# Patient Record
Sex: Male | Born: 1935 | Race: White | Hispanic: No | Marital: Married | Smoking: Former smoker
Health system: Southern US, Community
[De-identification: ages and names within clinical notes are randomized; demographics above are authoritative.]

## PROBLEM LIST (undated history)

## (undated) DIAGNOSIS — Z87442 Personal history of urinary calculi: Secondary | ICD-10-CM

## (undated) DIAGNOSIS — J189 Pneumonia, unspecified organism: Secondary | ICD-10-CM

## (undated) DIAGNOSIS — I10 Essential (primary) hypertension: Secondary | ICD-10-CM

## (undated) DIAGNOSIS — G8929 Other chronic pain: Secondary | ICD-10-CM

## (undated) DIAGNOSIS — M545 Low back pain, unspecified: Secondary | ICD-10-CM

## (undated) DIAGNOSIS — K219 Gastro-esophageal reflux disease without esophagitis: Secondary | ICD-10-CM

## (undated) DIAGNOSIS — C4491 Basal cell carcinoma of skin, unspecified: Secondary | ICD-10-CM

## (undated) DIAGNOSIS — C61 Malignant neoplasm of prostate: Secondary | ICD-10-CM

## (undated) DIAGNOSIS — M1990 Unspecified osteoarthritis, unspecified site: Secondary | ICD-10-CM

## (undated) DIAGNOSIS — K250 Acute gastric ulcer with hemorrhage: Secondary | ICD-10-CM

## (undated) DIAGNOSIS — E039 Hypothyroidism, unspecified: Secondary | ICD-10-CM

## (undated) HISTORY — PX: TONSILLECTOMY: SUR1361

## (undated) HISTORY — PX: CATARACT EXTRACTION W/ INTRAOCULAR LENS  IMPLANT, BILATERAL: SHX1307

## (undated) HISTORY — PX: ESOPHAGOGASTRODUODENOSCOPY: SHX1529

## (undated) HISTORY — PX: INSERTION PROSTATE RADIATION SEED: SUR718

## (undated) HISTORY — PX: COLONOSCOPY: SHX174

## (undated) HISTORY — PX: DENTAL SURGERY: SHX609

## (undated) HISTORY — PX: TRANSURETHRAL RESECTION OF PROSTATE: SHX73

## (undated) HISTORY — PX: BACK SURGERY: SHX140

## (undated) HISTORY — PX: BASAL CELL CARCINOMA EXCISION: SHX1214

## (undated) HISTORY — PX: PROSTATE BIOPSY: SHX241

---

## 1997-11-20 ENCOUNTER — Encounter: Payer: Self-pay | Admitting: Neurological Surgery

## 1997-11-20 ENCOUNTER — Ambulatory Visit (HOSPITAL_COMMUNITY): Admission: RE | Admit: 1997-11-20 | Discharge: 1997-11-20 | Payer: Self-pay | Admitting: Neurological Surgery

## 1997-11-27 ENCOUNTER — Encounter: Payer: Self-pay | Admitting: Neurological Surgery

## 1997-12-01 ENCOUNTER — Encounter: Payer: Self-pay | Admitting: Neurological Surgery

## 1997-12-01 ENCOUNTER — Inpatient Hospital Stay (HOSPITAL_COMMUNITY): Admission: RE | Admit: 1997-12-01 | Discharge: 1997-12-03 | Payer: Self-pay | Admitting: Neurological Surgery

## 2000-12-28 ENCOUNTER — Encounter: Payer: Self-pay | Admitting: Orthopedic Surgery

## 2000-12-28 ENCOUNTER — Ambulatory Visit (HOSPITAL_COMMUNITY): Admission: RE | Admit: 2000-12-28 | Discharge: 2000-12-28 | Payer: Self-pay | Admitting: Orthopedic Surgery

## 2002-07-31 ENCOUNTER — Inpatient Hospital Stay (HOSPITAL_COMMUNITY): Admission: RE | Admit: 2002-07-31 | Discharge: 2002-08-01 | Payer: Self-pay | Admitting: Neurological Surgery

## 2002-07-31 ENCOUNTER — Encounter: Payer: Self-pay | Admitting: Neurological Surgery

## 2002-08-20 ENCOUNTER — Ambulatory Visit (HOSPITAL_COMMUNITY): Admission: RE | Admit: 2002-08-20 | Discharge: 2002-08-20 | Payer: Self-pay | Admitting: Neurological Surgery

## 2002-08-20 ENCOUNTER — Encounter: Payer: Self-pay | Admitting: Neurological Surgery

## 2003-07-22 ENCOUNTER — Ambulatory Visit (HOSPITAL_COMMUNITY): Admission: RE | Admit: 2003-07-22 | Discharge: 2003-07-22 | Payer: Self-pay | Admitting: Neurological Surgery

## 2003-08-20 ENCOUNTER — Inpatient Hospital Stay (HOSPITAL_COMMUNITY): Admission: RE | Admit: 2003-08-20 | Discharge: 2003-08-23 | Payer: Self-pay | Admitting: Neurological Surgery

## 2005-01-16 DIAGNOSIS — K250 Acute gastric ulcer with hemorrhage: Secondary | ICD-10-CM

## 2005-01-16 HISTORY — DX: Acute gastric ulcer with hemorrhage: K25.0

## 2011-01-17 HISTORY — PX: INGUINAL HERNIA REPAIR: SUR1180

## 2012-05-16 DIAGNOSIS — J189 Pneumonia, unspecified organism: Secondary | ICD-10-CM

## 2012-05-16 HISTORY — DX: Pneumonia, unspecified organism: J18.9

## 2015-01-17 HISTORY — PX: RADIOFREQUENCY ABLATION NERVES: SUR1070

## 2015-10-19 ENCOUNTER — Other Ambulatory Visit (HOSPITAL_COMMUNITY): Payer: Self-pay | Admitting: Neurological Surgery

## 2015-10-19 ENCOUNTER — Other Ambulatory Visit: Payer: Self-pay | Admitting: Neurological Surgery

## 2015-10-19 DIAGNOSIS — M48061 Spinal stenosis, lumbar region without neurogenic claudication: Secondary | ICD-10-CM

## 2015-10-27 ENCOUNTER — Ambulatory Visit (HOSPITAL_COMMUNITY)
Admission: RE | Admit: 2015-10-27 | Discharge: 2015-10-27 | Disposition: A | Discharge status: Home / Self Care | Payer: Medicare Other | Source: Ambulatory Visit | Attending: Neurological Surgery | Admitting: Neurological Surgery

## 2015-10-27 DIAGNOSIS — M5116 Intervertebral disc disorders with radiculopathy, lumbar region: Secondary | ICD-10-CM | POA: Insufficient documentation

## 2015-10-27 DIAGNOSIS — M48061 Spinal stenosis, lumbar region without neurogenic claudication: Secondary | ICD-10-CM

## 2015-10-27 DIAGNOSIS — S0003XA Contusion of scalp, initial encounter: Secondary | ICD-10-CM | POA: Diagnosis not present

## 2015-10-27 DIAGNOSIS — M48062 Spinal stenosis, lumbar region with neurogenic claudication: Secondary | ICD-10-CM | POA: Insufficient documentation

## 2015-10-27 DIAGNOSIS — M4726 Other spondylosis with radiculopathy, lumbar region: Secondary | ICD-10-CM | POA: Diagnosis not present

## 2015-10-27 DIAGNOSIS — M4804 Spinal stenosis, thoracic region: Secondary | ICD-10-CM | POA: Insufficient documentation

## 2015-10-27 DIAGNOSIS — I700 Atherosclerosis of aorta: Secondary | ICD-10-CM | POA: Diagnosis not present

## 2015-10-27 MED ORDER — LIDOCAINE HCL 1 % IJ SOLN
INTRAMUSCULAR | Status: AC
  Filled 2015-10-27: qty 10

## 2015-10-27 MED ORDER — DIAZEPAM 5 MG PO TABS
ORAL_TABLET | ORAL | Status: AC
  Filled 2015-10-27: qty 2

## 2015-10-27 MED ORDER — IOPAMIDOL (ISOVUE-M 200) INJECTION 41%
INTRAMUSCULAR | Status: AC
Start: 2015-10-27 — End: 2015-10-27
  Filled 2015-10-27: qty 10

## 2015-10-27 MED ORDER — IOPAMIDOL (ISOVUE-M 200) INJECTION 41%
20.0000 mL | Freq: Once | INTRAMUSCULAR | Status: AC
  Administered 2015-10-27: 11 mL via INTRATHECAL

## 2015-10-27 MED ORDER — ONDANSETRON HCL 4 MG/2ML IJ SOLN: 4.0000 mg | Freq: Four times a day (QID) | INTRAMUSCULAR | Status: DC | PRN

## 2015-10-27 MED ORDER — HYDROCODONE-ACETAMINOPHEN 5-325 MG PO TABS: 1.0000 | ORAL_TABLET | ORAL | Status: DC | PRN

## 2015-10-27 MED ORDER — DIAZEPAM 5 MG PO TABS
10.0000 mg | ORAL_TABLET | Freq: Once | ORAL | Status: AC
  Administered 2015-10-27: 10 mg via ORAL

## 2015-10-27 MED ORDER — LIDOCAINE HCL (PF) 1 % IJ SOLN
5.0000 mL | Freq: Once | INTRAMUSCULAR | Status: AC
  Administered 2015-10-27: 5 mL via INTRADERMAL

## 2015-10-27 NOTE — Procedures (Signed)
Dr. Ammie Ferrier an 82 year old individual is had previous decompression fusion on several separate occasions. He has decompression fusion from L2 to the sacrum. There is evidence of spondylosis at L1-L2 and has had progressive deterioration of his gait along with weakness in his lower extremities. A myelogram is now being performed to further evaluate this process. He has more weakness seemingly on his right side than on his left side.  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 11 mL Findings: Spondylosis throughout the lumbar spine. CT from T10 to the sacrum

## 2015-10-27 NOTE — Discharge Instructions (Signed)
Myelography, Care After °These instructions give you information on caring for yourself after your procedure. Your doctor may also give you more specific instructions. Call your doctor if you have any problems or questions after your procedure. °HOME CARE °· Rest the first day. °· When you rest, lie flat, with your head slightly raised (elevated). °· Avoid heavy lifting and activity for 48 hours, or as told by your doctor. °· You may take the bandage (dressing) off one day after the test, or as told by your doctor. °· Take all medicines only as told by your doctor. °· Ask your doctor when it is okay to take a shower or bath. °· Ask your doctor when your test results will be ready and how you can get them. Make sure you follow up and get your results. °· Do not drink alcohol for 24 hours, or as told by your doctor. °· Drink enough fluid to keep your pee (urine) clear or pale yellow. °GET HELP IF:  °· You have a fever. °· You have a headache. °· You feel sick to your stomach (nauseous) or throw up (vomit). °· You have pain or cramping in your belly (abdomen). °GET HELP RIGHT AWAY IF:  °· You have a headache with a stiff neck or fever. °· You have trouble breathing. °· Any of the places where the needles were put in are: °¨ Puffy (swollen) or red. °¨ Sore or hot to the touch. °¨ Draining yellowish-white fluid (pus). °¨ Bleeding. °MAKE SURE YOU: °· Understand these instructions. °· Will watch your condition. °· Will get help right away if you are not doing well or get worse. °  °This information is not intended to replace advice given to you by your health care provider. Make sure you discuss any questions you have with your health care provider. °  °Document Released: 10/12/2007 Document Revised: 01/23/2014 Document Reviewed: 09/27/2011 °Elsevier Interactive Patient Education ©2016 [employer] ° °

## 2015-11-18 ENCOUNTER — Other Ambulatory Visit: Payer: Self-pay | Admitting: Neurological Surgery

## 2015-12-14 NOTE — Pre-Procedure Instructions (Signed)
Christopher Hall  12/14/2015      [facility], [place] [address] [place] [address] Phone: [phone] Fax: [phone]    Your procedure is scheduled on December 4  Report to [place] at [facility].M.  Call this number if you have problems the morning of surgery:  [phone]   Remember:  Do not eat food or drink liquids after midnight.   Take these medicines the morning of surgery with A SIP OF WATER amLODipine (NORVASC), gabapentin (NEURONTIN),  levothyroxine (SYNTHROID, LEVOTHROID), omeprazole (PRILOSEC)  7 days prior to surgery STOP taking any Aspirin, Aleve, Naproxen, Ibuprofen, Motrin, Advil, Goody's, BC's, all herbal medications, fish oil, and all vitamins    Do not wear jewelry.  Do not wear lotions, powders, or cologne, or deoderant.  Men may shave face and neck.  Do not bring valuables to the hospital.  [facility] is not responsible for any belongings or valuables.  Contacts, dentures or bridgework may not be worn into surgery.  Leave your suitcase in the car.  After surgery it may be brought to your room.  For patients admitted to the hospital, discharge time will be determined by your treatment team.  Patients discharged the day of surgery will not be allowed to drive home.    Special instructions:   [facility]- Preparing For Surgery  Before surgery, you can play an important role. Because skin is not sterile, your skin needs to be as free of germs as possible. You can reduce the number of germs on your skin by washing with CHG (chlorahexidine gluconate) Soap before surgery.  CHG is an antiseptic cleaner which kills germs and bonds with the skin to continue killing germs even after washing.  Please do not use if you have an allergy to CHG or antibacterial soaps. If your skin becomes reddened/irritated stop using the CHG.  Do not shave (including legs and underarms) for at  least 48 hours prior to first CHG shower. It is OK to shave your face.  Please follow these instructions carefully.   1. Shower the NIGHT BEFORE SURGERY and the MORNING OF SURGERY with CHG.   2. If you chose to wash your hair, wash your hair first as usual with your normal shampoo.  3. After you shampoo, rinse your hair and body thoroughly to remove the shampoo.  4. Use CHG as you would any other liquid soap. You can apply CHG directly to the skin and wash gently with a scrungie or a clean washcloth.   5. Apply the CHG Soap to your body ONLY FROM THE NECK DOWN.  Do not use on open wounds or open sores. Avoid contact with your eyes, ears, mouth and genitals (private parts). Wash genitals (private parts) with your normal soap.  6. Wash thoroughly, paying special attention to the area where your surgery will be performed.  7. Thoroughly rinse your body with warm water from the neck down.  8. DO NOT shower/wash with your normal soap after using and rinsing off the CHG Soap.  9. Pat yourself dry with a CLEAN TOWEL.   10. Wear CLEAN PAJAMAS   11. Place CLEAN SHEETS on your bed the night of your first shower and DO NOT SLEEP WITH PETS.    Day of Surgery: Do not apply any deodorants/lotions. Please wear clean clothes to the hospital/surgery center.      Please read over the following  fact sheets that you were given.

## 2015-12-15 ENCOUNTER — Encounter (HOSPITAL_COMMUNITY): Payer: Self-pay

## 2015-12-15 ENCOUNTER — Other Ambulatory Visit: Payer: Self-pay

## 2015-12-15 ENCOUNTER — Encounter (HOSPITAL_COMMUNITY)
Admission: RE | Admit: 2015-12-15 | Discharge: 2015-12-15 | Disposition: A | Discharge status: Home / Self Care | Payer: Medicare Other | Source: Ambulatory Visit | Attending: Neurological Surgery | Admitting: Neurological Surgery

## 2015-12-15 DIAGNOSIS — Z0183 Encounter for blood typing: Secondary | ICD-10-CM | POA: Insufficient documentation

## 2015-12-15 DIAGNOSIS — Z01818 Encounter for other preprocedural examination: Secondary | ICD-10-CM | POA: Diagnosis present

## 2015-12-15 DIAGNOSIS — M960 Pseudarthrosis after fusion or arthrodesis: Secondary | ICD-10-CM | POA: Diagnosis not present

## 2015-12-15 DIAGNOSIS — Z01812 Encounter for preprocedural laboratory examination: Secondary | ICD-10-CM | POA: Insufficient documentation

## 2015-12-15 HISTORY — DX: Personal history of urinary calculi: Z87.442

## 2015-12-15 HISTORY — DX: Essential (primary) hypertension: I10

## 2015-12-15 HISTORY — DX: Hypothyroidism, unspecified: E03.9

## 2015-12-15 HISTORY — DX: Unspecified osteoarthritis, unspecified site: M19.90

## 2015-12-15 HISTORY — DX: Gastro-esophageal reflux disease without esophagitis: K21.9

## 2015-12-15 HISTORY — DX: Pneumonia, unspecified organism: J18.9

## 2015-12-15 LAB — BASIC METABOLIC PANEL
ANION GAP: 10 (ref 5–15)
BUN: 18 mg/dL (ref 6–20)
CALCIUM: 10 mg/dL (ref 8.9–10.3)
CO2: 26 mmol/L (ref 22–32)
Chloride: 96 mmol/L — ABNORMAL LOW (ref 101–111)
Creatinine, Ser: 1.63 mg/dL — ABNORMAL HIGH (ref 0.61–1.24)
GFR calc Af Amer: 43 mL/min — ABNORMAL LOW (ref >60)
GFR, EST NON AFRICAN AMERICAN: 37 mL/min — AB (ref >60)
Glucose, Bld: 104 mg/dL — ABNORMAL HIGH (ref 65–99)
POTASSIUM: 3.3 mmol/L — AB (ref 3.5–5.1)
SODIUM: 132 mmol/L — AB (ref 135–145)

## 2015-12-15 LAB — CBC
HEMATOCRIT: 43.6 % (ref 39.0–52.0)
Hemoglobin: 15.4 g/dL (ref 13.0–17.0)
MCH: 30.8 pg (ref 26.0–34.0)
MCHC: 35.3 g/dL (ref 30.0–36.0)
MCV: 87.2 fL (ref 78.0–100.0)
Platelets: 237 10*3/uL (ref 150–400)
RBC: 5 MIL/uL (ref 4.22–5.81)
RDW: 13.1 % (ref 11.5–15.5)
WBC: 7 10*3/uL (ref 4.0–10.5)

## 2015-12-15 LAB — TYPE AND SCREEN
ABO/RH(D): A POS
Antibody Screen: NEGATIVE

## 2015-12-15 LAB — SURGICAL PCR SCREEN
MRSA, PCR: NEGATIVE
STAPHYLOCOCCUS AUREUS: NEGATIVE

## 2015-12-15 LAB — ABO/RH: ABO/RH(D): A POS

## 2015-12-15 NOTE — Progress Notes (Signed)
PCP - Romilda Garret Cardiologist - denies  Chest x-ray - not needed EKG - 12/15/15 Stress Test - denies ECHO - denies Cardiac Cath - denies    Patient denies shortness of breath, fever, cough and chest pain at PAT appointment

## 2015-12-19 MED ORDER — CEFAZOLIN SODIUM-DEXTROSE 2-4 GM/100ML-% IV SOLN
2.0000 g | INTRAVENOUS | Status: AC
  Administered 2015-12-20: 2 g via INTRAVENOUS
  Filled 2015-12-19: qty 100

## 2015-12-20 ENCOUNTER — Inpatient Hospital Stay (HOSPITAL_COMMUNITY)
Admission: RE | Admit: 2015-12-20 | Discharge: 2015-12-24 | DRG: 460 | Disposition: A | Discharge status: Home Health | Payer: Medicare Other | Source: Ambulatory Visit | Attending: Neurological Surgery | Admitting: Neurological Surgery

## 2015-12-20 ENCOUNTER — Inpatient Hospital Stay (HOSPITAL_COMMUNITY): Payer: Medicare Other

## 2015-12-20 ENCOUNTER — Inpatient Hospital Stay (HOSPITAL_COMMUNITY): Payer: Medicare Other | Admitting: Certified Registered"

## 2015-12-20 ENCOUNTER — Encounter (HOSPITAL_COMMUNITY): Payer: Self-pay | Admitting: Certified Registered"

## 2015-12-20 ENCOUNTER — Encounter (HOSPITAL_COMMUNITY)
Admission: RE | Disposition: A | Discharge status: Home Health | Payer: Self-pay | Source: Ambulatory Visit | Attending: Neurological Surgery

## 2015-12-20 DIAGNOSIS — T84216A Breakdown (mechanical) of internal fixation device of vertebrae, initial encounter: Secondary | ICD-10-CM | POA: Diagnosis present

## 2015-12-20 DIAGNOSIS — I10 Essential (primary) hypertension: Secondary | ICD-10-CM | POA: Diagnosis present

## 2015-12-20 DIAGNOSIS — M960 Pseudarthrosis after fusion or arthrodesis: Principal | ICD-10-CM | POA: Diagnosis present

## 2015-12-20 DIAGNOSIS — Z85828 Personal history of other malignant neoplasm of skin: Secondary | ICD-10-CM

## 2015-12-20 DIAGNOSIS — Z87891 Personal history of nicotine dependence: Secondary | ICD-10-CM | POA: Diagnosis not present

## 2015-12-20 DIAGNOSIS — M4317 Spondylolisthesis, lumbosacral region: Secondary | ICD-10-CM | POA: Diagnosis present

## 2015-12-20 DIAGNOSIS — E039 Hypothyroidism, unspecified: Secondary | ICD-10-CM | POA: Diagnosis present

## 2015-12-20 DIAGNOSIS — S32009K Unspecified fracture of unspecified lumbar vertebra, subsequent encounter for fracture with nonunion: Secondary | ICD-10-CM | POA: Diagnosis present

## 2015-12-20 DIAGNOSIS — M4805 Spinal stenosis, thoracolumbar region: Secondary | ICD-10-CM | POA: Diagnosis present

## 2015-12-20 DIAGNOSIS — R32 Unspecified urinary incontinence: Secondary | ICD-10-CM | POA: Diagnosis present

## 2015-12-20 DIAGNOSIS — M4804 Spinal stenosis, thoracic region: Secondary | ICD-10-CM | POA: Diagnosis present

## 2015-12-20 DIAGNOSIS — Z8546 Personal history of malignant neoplasm of prostate: Secondary | ICD-10-CM

## 2015-12-20 DIAGNOSIS — M48061 Spinal stenosis, lumbar region without neurogenic claudication: Secondary | ICD-10-CM | POA: Diagnosis present

## 2015-12-20 DIAGNOSIS — M5416 Radiculopathy, lumbar region: Secondary | ICD-10-CM | POA: Diagnosis present

## 2015-12-20 DIAGNOSIS — Z419 Encounter for procedure for purposes other than remedying health state, unspecified: Secondary | ICD-10-CM

## 2015-12-20 HISTORY — DX: Low back pain: M54.5

## 2015-12-20 HISTORY — DX: Acute gastric ulcer with hemorrhage: K25.0

## 2015-12-20 HISTORY — DX: Malignant neoplasm of prostate: C61

## 2015-12-20 HISTORY — DX: Other chronic pain: G89.29

## 2015-12-20 HISTORY — PX: POSTERIOR LUMBAR FUSION 4 LEVEL: SHX6037

## 2015-12-20 HISTORY — DX: Basal cell carcinoma of skin, unspecified: C44.91

## 2015-12-20 HISTORY — DX: Low back pain, unspecified: M54.50

## 2015-12-20 SURGERY — POSTERIOR LUMBAR FUSION 4 LEVEL
Anesthesia: General | Site: Back

## 2015-12-20 MED ORDER — PHENYLEPHRINE HCL 10 MG/ML IJ SOLN
INTRAVENOUS | Status: DC | PRN
  Administered 2015-12-20 (×2): 20 ug/min via INTRAVENOUS

## 2015-12-20 MED ORDER — SODIUM CHLORIDE 0.9 % IV SOLN: 250.0000 mL | INTRAVENOUS | Status: DC

## 2015-12-20 MED ORDER — AMLODIPINE BESYLATE 10 MG PO TABS
10.0000 mg | ORAL_TABLET | Freq: Every evening | ORAL | Status: DC
  Administered 2015-12-21 – 2015-12-23 (×3): 10 mg via ORAL
  Filled 2015-12-20 (×4): qty 1

## 2015-12-20 MED ORDER — SODIUM CHLORIDE 0.9 % IV SOLN
INTRAVENOUS | Status: DC
  Administered 2015-12-20: 17:00:00 via INTRAVENOUS

## 2015-12-20 MED ORDER — ONDANSETRON HCL 4 MG/2ML IJ SOLN: 4.0000 mg | INTRAMUSCULAR | Status: DC | PRN

## 2015-12-20 MED ORDER — PHENYLEPHRINE HCL 10 MG/ML IJ SOLN
INTRAMUSCULAR | Status: DC | PRN
  Administered 2015-12-20 (×3): 80 ug via INTRAVENOUS

## 2015-12-20 MED ORDER — LIDOCAINE-EPINEPHRINE (PF) 2 %-1:200000 IJ SOLN
INTRAMUSCULAR | Status: DC | PRN
  Administered 2015-12-20: 5 mL via INTRADERMAL

## 2015-12-20 MED ORDER — ACETAMINOPHEN 650 MG RE SUPP: 650.0000 mg | RECTAL | Status: DC | PRN

## 2015-12-20 MED ORDER — PHENYLEPHRINE 40 MCG/ML (10ML) SYRINGE FOR IV PUSH (FOR BLOOD PRESSURE SUPPORT)
PREFILLED_SYRINGE | INTRAVENOUS | Status: AC
  Filled 2015-12-20: qty 10

## 2015-12-20 MED ORDER — ARTIFICIAL TEARS OP OINT
TOPICAL_OINTMENT | OPHTHALMIC | Status: DC | PRN
  Administered 2015-12-20: 1 via OPHTHALMIC

## 2015-12-20 MED ORDER — CEFAZOLIN IN D5W 1 GM/50ML IV SOLN
1.0000 g | Freq: Three times a day (TID) | INTRAVENOUS | Status: AC
  Administered 2015-12-20 – 2015-12-21 (×2): 1 g via INTRAVENOUS
  Filled 2015-12-20 (×2): qty 50

## 2015-12-20 MED ORDER — ROCURONIUM BROMIDE 100 MG/10ML IV SOLN
INTRAVENOUS | Status: DC | PRN
  Administered 2015-12-20 (×2): 20 mg via INTRAVENOUS
  Administered 2015-12-20: 10 mg via INTRAVENOUS
  Administered 2015-12-20: 50 mg via INTRAVENOUS
  Administered 2015-12-20: 20 mg via INTRAVENOUS

## 2015-12-20 MED ORDER — BUPIVACAINE HCL (PF) 0.5 % IJ SOLN
INTRAMUSCULAR | Status: DC | PRN
  Administered 2015-12-20: 25 mL
  Administered 2015-12-20: 5 mL

## 2015-12-20 MED ORDER — MAGNESIUM CITRATE PO SOLN
1.0000 | Freq: Once | ORAL | Status: DC | PRN
  Filled 2015-12-20: qty 296

## 2015-12-20 MED ORDER — SODIUM CHLORIDE 0.9% FLUSH
3.0000 mL | Freq: Two times a day (BID) | INTRAVENOUS | Status: DC
  Administered 2015-12-21 – 2015-12-23 (×3): 3 mL via INTRAVENOUS
  Administered 2015-12-23: 22:00:00 via INTRAVENOUS

## 2015-12-20 MED ORDER — SODIUM CHLORIDE 0.9 % IV SOLN
INTRAVENOUS | Status: DC | PRN
  Administered 2015-12-20: 12:00:00 via INTRAVENOUS

## 2015-12-20 MED ORDER — 0.9 % SODIUM CHLORIDE (POUR BTL) OPTIME
TOPICAL | Status: DC | PRN
  Administered 2015-12-20: 1000 mL

## 2015-12-20 MED ORDER — THROMBIN 20000 UNITS EX SOLR
CUTANEOUS | Status: AC
  Filled 2015-12-20: qty 20000

## 2015-12-20 MED ORDER — SODIUM CHLORIDE 0.9% FLUSH: 3.0000 mL | INTRAVENOUS | Status: DC | PRN

## 2015-12-20 MED ORDER — SODIUM CHLORIDE 0.9 % IR SOLN
Status: DC | PRN
  Administered 2015-12-20: 500 mL

## 2015-12-20 MED ORDER — LIDOCAINE 2% (20 MG/ML) 5 ML SYRINGE
INTRAMUSCULAR | Status: AC
  Filled 2015-12-20: qty 5

## 2015-12-20 MED ORDER — BUPIVACAINE HCL (PF) 0.5 % IJ SOLN
INTRAMUSCULAR | Status: AC
  Filled 2015-12-20: qty 30

## 2015-12-20 MED ORDER — ARTIFICIAL TEARS OP OINT
TOPICAL_OINTMENT | OPHTHALMIC | Status: AC
  Filled 2015-12-20: qty 3.5

## 2015-12-20 MED ORDER — ONDANSETRON HCL 4 MG/2ML IJ SOLN
INTRAMUSCULAR | Status: DC | PRN
Start: 2015-12-20 — End: 2015-12-20
  Administered 2015-12-20: 4 mg via INTRAVENOUS

## 2015-12-20 MED ORDER — DEXAMETHASONE SODIUM PHOSPHATE 10 MG/ML IJ SOLN
INTRAMUSCULAR | Status: AC
  Filled 2015-12-20: qty 1

## 2015-12-20 MED ORDER — THROMBIN 20000 UNITS EX SOLR
CUTANEOUS | Status: DC | PRN
  Administered 2015-12-20: 20 mL via TOPICAL

## 2015-12-20 MED ORDER — GABAPENTIN 300 MG PO CAPS
600.0000 mg | ORAL_CAPSULE | Freq: Two times a day (BID) | ORAL | Status: DC
  Administered 2015-12-20 – 2015-12-23 (×7): 600 mg via ORAL
  Filled 2015-12-20 (×8): qty 2

## 2015-12-20 MED ORDER — EPHEDRINE SULFATE 50 MG/ML IJ SOLN
INTRAMUSCULAR | Status: DC | PRN
  Administered 2015-12-20 (×3): 5 mg via INTRAVENOUS

## 2015-12-20 MED ORDER — DOCUSATE SODIUM 100 MG PO CAPS
100.0000 mg | ORAL_CAPSULE | Freq: Two times a day (BID) | ORAL | Status: DC
  Administered 2015-12-20 – 2015-12-23 (×7): 100 mg via ORAL
  Filled 2015-12-20 (×9): qty 1

## 2015-12-20 MED ORDER — THROMBIN 5000 UNITS EX SOLR
OROMUCOSAL | Status: DC | PRN
  Administered 2015-12-20 (×3): 5 mL via TOPICAL

## 2015-12-20 MED ORDER — THROMBIN 5000 UNITS EX SOLR
CUTANEOUS | Status: AC
  Filled 2015-12-20: qty 5000

## 2015-12-20 MED ORDER — ALUM & MAG HYDROXIDE-SIMETH 200-200-20 MG/5ML PO SUSP: 30.0000 mL | Freq: Four times a day (QID) | ORAL | Status: DC | PRN

## 2015-12-20 MED ORDER — OXYCODONE-ACETAMINOPHEN 5-325 MG PO TABS
1.0000 | ORAL_TABLET | ORAL | Status: DC | PRN
  Administered 2015-12-21 – 2015-12-24 (×3): 1 via ORAL
  Filled 2015-12-20 (×3): qty 1

## 2015-12-20 MED ORDER — METHOCARBAMOL 1000 MG/10ML IJ SOLN
500.0000 mg | Freq: Four times a day (QID) | INTRAVENOUS | Status: DC | PRN
  Filled 2015-12-20: qty 5

## 2015-12-20 MED ORDER — SUGAMMADEX SODIUM 200 MG/2ML IV SOLN
INTRAVENOUS | Status: DC | PRN
  Administered 2015-12-20: 162.8 mg via INTRAVENOUS

## 2015-12-20 MED ORDER — POLYETHYLENE GLYCOL 3350 17 G PO PACK
17.0000 g | PACK | Freq: Every day | ORAL | Status: DC | PRN
  Administered 2015-12-23: 17 g via ORAL
  Filled 2015-12-20: qty 1

## 2015-12-20 MED ORDER — ACETAMINOPHEN 325 MG PO TABS
650.0000 mg | ORAL_TABLET | ORAL | Status: DC | PRN
  Administered 2015-12-21 – 2015-12-23 (×6): 650 mg via ORAL
  Filled 2015-12-20 (×6): qty 2

## 2015-12-20 MED ORDER — CHLORHEXIDINE GLUCONATE CLOTH 2 % EX PADS: 6.0000 | MEDICATED_PAD | Freq: Once | CUTANEOUS | Status: DC

## 2015-12-20 MED ORDER — FENTANYL CITRATE (PF) 100 MCG/2ML IJ SOLN
INTRAMUSCULAR | Status: AC
  Filled 2015-12-20: qty 4

## 2015-12-20 MED ORDER — POTASSIUM CHLORIDE CRYS ER 10 MEQ PO TBCR
10.0000 meq | EXTENDED_RELEASE_TABLET | Freq: Every evening | ORAL | Status: DC
  Administered 2015-12-20 – 2015-12-23 (×4): 10 meq via ORAL
  Filled 2015-12-20 (×4): qty 1

## 2015-12-20 MED ORDER — MIRTAZAPINE 15 MG PO TABS
15.0000 mg | ORAL_TABLET | Freq: Every day | ORAL | Status: DC
  Administered 2015-12-20 – 2015-12-23 (×4): 15 mg via ORAL
  Filled 2015-12-20 (×4): qty 1

## 2015-12-20 MED ORDER — LIDOCAINE-EPINEPHRINE (PF) 2 %-1:200000 IJ SOLN
INTRAMUSCULAR | Status: AC
  Filled 2015-12-20: qty 20

## 2015-12-20 MED ORDER — PROPOFOL 10 MG/ML IV BOLUS
INTRAVENOUS | Status: DC | PRN
  Administered 2015-12-20: 160 mg via INTRAVENOUS

## 2015-12-20 MED ORDER — LACTATED RINGERS IV SOLN
INTRAVENOUS | Status: DC | PRN
  Administered 2015-12-20 (×3): via INTRAVENOUS

## 2015-12-20 MED ORDER — PANTOPRAZOLE SODIUM 40 MG PO TBEC
40.0000 mg | DELAYED_RELEASE_TABLET | Freq: Every day | ORAL | Status: DC
  Administered 2015-12-21 – 2015-12-23 (×3): 40 mg via ORAL
  Filled 2015-12-20 (×4): qty 1

## 2015-12-20 MED ORDER — HYDROCHLOROTHIAZIDE 25 MG PO TABS
25.0000 mg | ORAL_TABLET | Freq: Two times a day (BID) | ORAL | Status: DC
  Administered 2015-12-20 – 2015-12-23 (×7): 25 mg via ORAL
  Filled 2015-12-20 (×8): qty 1

## 2015-12-20 MED ORDER — PROPOFOL 10 MG/ML IV BOLUS
INTRAVENOUS | Status: AC
  Filled 2015-12-20: qty 20

## 2015-12-20 MED ORDER — LIDOCAINE HCL (CARDIAC) 20 MG/ML IV SOLN
INTRAVENOUS | Status: DC | PRN
  Administered 2015-12-20: 100 mg via INTRAVENOUS

## 2015-12-20 MED ORDER — THROMBIN 5000 UNITS EX SOLR
CUTANEOUS | Status: AC
  Filled 2015-12-20: qty 10000

## 2015-12-20 MED ORDER — BISACODYL 10 MG RE SUPP
10.0000 mg | Freq: Every day | RECTAL | Status: DC | PRN
  Administered 2015-12-23: 10 mg via RECTAL
  Filled 2015-12-20: qty 1

## 2015-12-20 MED ORDER — DEXAMETHASONE SODIUM PHOSPHATE 10 MG/ML IJ SOLN
INTRAMUSCULAR | Status: DC | PRN
  Administered 2015-12-20: 10 mg via INTRAVENOUS

## 2015-12-20 MED ORDER — LEVOTHYROXINE SODIUM 50 MCG PO TABS
50.0000 ug | ORAL_TABLET | Freq: Every evening | ORAL | Status: DC
  Administered 2015-12-21 – 2015-12-23 (×3): 50 ug via ORAL
  Filled 2015-12-20 (×4): qty 1

## 2015-12-20 MED ORDER — ROCURONIUM BROMIDE 10 MG/ML (PF) SYRINGE
PREFILLED_SYRINGE | INTRAVENOUS | Status: AC
  Filled 2015-12-20: qty 20

## 2015-12-20 MED ORDER — MENTHOL 3 MG MT LOZG
1.0000 | LOZENGE | OROMUCOSAL | Status: DC | PRN
  Filled 2015-12-20: qty 9

## 2015-12-20 MED ORDER — PSYLLIUM 95 % PO PACK
1.0000 | PACK | Freq: Two times a day (BID) | ORAL | Status: DC
  Administered 2015-12-21 – 2015-12-23 (×6): 1 via ORAL
  Filled 2015-12-20 (×9): qty 1

## 2015-12-20 MED ORDER — SENNA 8.6 MG PO TABS
1.0000 | ORAL_TABLET | Freq: Two times a day (BID) | ORAL | Status: DC
  Administered 2015-12-20 – 2015-12-23 (×7): 8.6 mg via ORAL
  Filled 2015-12-20 (×8): qty 1

## 2015-12-20 MED ORDER — FENTANYL CITRATE (PF) 100 MCG/2ML IJ SOLN
INTRAMUSCULAR | Status: DC | PRN
  Administered 2015-12-20: 50 ug via INTRAVENOUS
  Administered 2015-12-20: 25 ug via INTRAVENOUS
  Administered 2015-12-20: 50 ug via INTRAVENOUS
  Administered 2015-12-20: 25 ug via INTRAVENOUS
  Administered 2015-12-20: 100 ug via INTRAVENOUS
  Administered 2015-12-20: 50 ug via INTRAVENOUS

## 2015-12-20 MED ORDER — ALBUMIN HUMAN 5 % IV SOLN
INTRAVENOUS | Status: DC | PRN
  Administered 2015-12-20: 11:00:00 via INTRAVENOUS

## 2015-12-20 MED ORDER — PHENOL 1.4 % MT LIQD: 1.0000 | OROMUCOSAL | Status: DC | PRN

## 2015-12-20 MED ORDER — HYDROMORPHONE HCL 1 MG/ML IJ SOLN: 0.5000 mg | INTRAMUSCULAR | Status: DC | PRN

## 2015-12-20 MED ORDER — METHOCARBAMOL 500 MG PO TABS
500.0000 mg | ORAL_TABLET | Freq: Four times a day (QID) | ORAL | Status: DC | PRN
  Filled 2015-12-20: qty 1

## 2015-12-20 MED ORDER — FENTANYL CITRATE (PF) 100 MCG/2ML IJ SOLN
INTRAMUSCULAR | Status: AC
  Filled 2015-12-20: qty 2

## 2015-12-20 SURGICAL SUPPLY — 74 items
ADH SKN CLS APL DERMABOND .7 (GAUZE/BANDAGES/DRESSINGS) ×1
APL SRG 60D 8 XTD TIP BNDBL (TIP)
BAG DECANTER FOR FLEXI CONT (MISCELLANEOUS) ×3 IMPLANT
BASKET BONE COLLECTION (BASKET) ×3 IMPLANT
BLADE CLIPPER SURG (BLADE) ×4 IMPLANT
BUR MATCHSTICK NEURO 3.0 LAGG (BURR) ×3 IMPLANT
CANISTER SUCT 3000ML PPV (MISCELLANEOUS) ×3 IMPLANT
CARTRIDGE OIL MAESTRO DRILL (MISCELLANEOUS) ×1 IMPLANT
CATH COUDE FOLEY 2W 5CC 16FR (CATHETERS) ×2 IMPLANT
CONNECTOR RELINE 20MM OPEN OFF (Connector) ×2 IMPLANT
CONNECTOR RELINE 30MM OPEN OFF (Connector) ×2 IMPLANT
CONT SPEC 4OZ CLIKSEAL STRL BL (MISCELLANEOUS) ×3 IMPLANT
COVER BACK TABLE 60X90IN (DRAPES) ×3 IMPLANT
DECANTER SPIKE VIAL GLASS SM (MISCELLANEOUS) ×5 IMPLANT
DERMABOND ADVANCED (GAUZE/BANDAGES/DRESSINGS) ×2
DERMABOND ADVANCED .7 DNX12 (GAUZE/BANDAGES/DRESSINGS) ×1 IMPLANT
DEVICE DISSECT PLASMABLAD 3.0S (MISCELLANEOUS) ×1 IMPLANT
DIFFUSER DRILL AIR PNEUMATIC (MISCELLANEOUS) ×3 IMPLANT
DRAPE C-ARM 42X72 X-RAY (DRAPES) ×6 IMPLANT
DRAPE HALF SHEET 40X57 (DRAPES) ×2 IMPLANT
DRAPE LAPAROTOMY 100X72X124 (DRAPES) ×3 IMPLANT
DRAPE POUCH INSTRU U-SHP 10X18 (DRAPES) ×3 IMPLANT
DRSG OPSITE POSTOP 4X8 (GAUZE/BANDAGES/DRESSINGS) ×2 IMPLANT
DURAPREP 26ML APPLICATOR (WOUND CARE) ×3 IMPLANT
DURASEAL APPLICATOR TIP (TIP) IMPLANT
DURASEAL SPINE SEALANT 3ML (MISCELLANEOUS) IMPLANT
ELECT REM PT RETURN 9FT ADLT (ELECTROSURGICAL) ×3
ELECTRODE REM PT RTRN 9FT ADLT (ELECTROSURGICAL) ×1 IMPLANT
GAUZE SPONGE 4X4 12PLY STRL (GAUZE/BANDAGES/DRESSINGS) ×1 IMPLANT
GAUZE SPONGE 4X4 16PLY XRAY LF (GAUZE/BANDAGES/DRESSINGS) ×2 IMPLANT
GLOVE BIO SURGEON STRL SZ8.5 (GLOVE) ×2 IMPLANT
GLOVE BIOGEL PI IND STRL 7.5 (GLOVE) IMPLANT
GLOVE BIOGEL PI IND STRL 8.5 (GLOVE) ×2 IMPLANT
GLOVE BIOGEL PI INDICATOR 7.5 (GLOVE) ×2
GLOVE BIOGEL PI INDICATOR 8.5 (GLOVE) ×6
GLOVE ECLIPSE 8.5 STRL (GLOVE) ×8 IMPLANT
GLOVE SS N UNI LF 7.0 STRL (GLOVE) ×2 IMPLANT
GOWN STRL REUS W/ TWL LRG LVL3 (GOWN DISPOSABLE) IMPLANT
GOWN STRL REUS W/ TWL XL LVL3 (GOWN DISPOSABLE) IMPLANT
GOWN STRL REUS W/TWL 2XL LVL3 (GOWN DISPOSABLE) ×8 IMPLANT
GOWN STRL REUS W/TWL LRG LVL3 (GOWN DISPOSABLE) ×3
GOWN STRL REUS W/TWL XL LVL3 (GOWN DISPOSABLE) ×6
GRAFT BN 10X1XDBM MAGNIFUSE (Bone Implant) IMPLANT
GRAFT BONE MAGNIFUSE 1X10CM (Bone Implant) ×3 IMPLANT
HEMOSTAT POWDER KIT SURGIFOAM (HEMOSTASIS) ×6 IMPLANT
KIT BASIN OR (CUSTOM PROCEDURE TRAY) ×3 IMPLANT
KIT INFUSE MEDIUM (Orthopedic Implant) ×2 IMPLANT
KIT ROOM TURNOVER OR (KITS) ×3 IMPLANT
NEEDLE HYPO 22GX1.5 SAFETY (NEEDLE) ×3 IMPLANT
NS IRRIG 1000ML POUR BTL (IV SOLUTION) ×3 IMPLANT
OIL CARTRIDGE MAESTRO DRILL (MISCELLANEOUS) ×3
PACK LAMINECTOMY NEURO (CUSTOM PROCEDURE TRAY) ×3 IMPLANT
PAD ARMBOARD 7.5X6 YLW CONV (MISCELLANEOUS) ×9 IMPLANT
PATTIES SURGICAL .5 X1 (DISPOSABLE) ×3 IMPLANT
PLASMABLADE 3.0S (MISCELLANEOUS) ×3
ROD RELINE-O 5.5X120MM LORD (Rod) ×2 IMPLANT
ROD RELINE-O LORD 5.5X110MM (Rod) ×2 IMPLANT
SCREW LOCK RELINE 5.5 TULIP (Screw) ×16 IMPLANT
SCREW LOCK RELINE CLOSED TULIP (Screw) ×4 IMPLANT
SCREW RELIN ILIAC 7.5X70MM 2S (Screw) ×4 IMPLANT
SCREW RELINE-O POLY 6.5X50MM (Screw) ×8 IMPLANT
SCREW RELINE-O POLY 7.5X45 (Screw) ×4 IMPLANT
SPONGE LAP 4X18 X RAY DECT (DISPOSABLE) IMPLANT
SPONGE SURGIFOAM ABS GEL 100 (HEMOSTASIS) ×3 IMPLANT
SUT PROLENE 6 0 BV (SUTURE) ×2 IMPLANT
SUT VIC AB 1 CT1 18XBRD ANBCTR (SUTURE) ×1 IMPLANT
SUT VIC AB 1 CT1 8-18 (SUTURE) ×6
SUT VIC AB 2-0 CP2 18 (SUTURE) ×5 IMPLANT
SUT VIC AB 3-0 SH 8-18 (SUTURE) ×7 IMPLANT
SYR 3ML LL SCALE MARK (SYRINGE) ×12 IMPLANT
TOWEL OR 17X24 6PK STRL BLUE (TOWEL DISPOSABLE) ×3 IMPLANT
TOWEL OR 17X26 10 PK STRL BLUE (TOWEL DISPOSABLE) ×3 IMPLANT
TRAY FOLEY W/METER SILVER 16FR (SET/KITS/TRAYS/PACK) ×3 IMPLANT
WATER STERILE IRR 1000ML POUR (IV SOLUTION) ×3 IMPLANT

## 2015-12-20 NOTE — Anesthesia Postprocedure Evaluation (Signed)
Anesthesia Post Note  Patient: Harriett Rush  Procedure(s) Performed: Procedure(s) (LRB): Revision pseudoarthrosis, Decompression Left Lumbar five Sacral one nerve root, Segmental fixation lumbar three-ilium, Posterior Arthrodesis with allograft and infuse (N/A)  Patient location during evaluation: PACU Anesthesia Type: General Level of consciousness: awake and alert Pain management: pain level controlled Vital Signs Assessment: post-procedure vital signs reviewed and stable Respiratory status: spontaneous breathing, nonlabored ventilation, respiratory function stable and patient connected to nasal cannula oxygen Cardiovascular status: blood pressure returned to baseline and stable Postop Assessment: no signs of nausea or vomiting Anesthetic complications: no    Last Vitals:  Vitals:   12/20/15 1445 12/20/15 1500  BP: 112/73 124/77  Pulse: 77 77  Resp: 11 17  Temp:      Last Pain:  Vitals:   12/20/15 1500  TempSrc:   PainSc: 3                  Montez Hageman

## 2015-12-20 NOTE — Anesthesia Preprocedure Evaluation (Signed)
Anesthesia Evaluation  Patient identified by MRN, date of birth, ID band Patient awake    Reviewed: Allergy & Precautions, NPO status , Patient's Chart, lab work & pertinent test results  Airway Mallampati: II  TM Distance: >3 FB Neck ROM: Full    Dental no notable dental hx.    Pulmonary former smoker,    Pulmonary exam normal breath sounds clear to auscultation       Cardiovascular hypertension, Pt. on medications Normal cardiovascular exam Rhythm:Regular Rate:Normal     Neuro/Psych negative neurological ROS  negative psych ROS   GI/Hepatic negative GI ROS, Neg liver ROS,   Endo/Other  Hypothyroidism   Renal/GU negative Renal ROS  negative genitourinary   Musculoskeletal negative musculoskeletal ROS (+)   Abdominal   Peds negative pediatric ROS (+)  Hematology negative hematology ROS (+)   Anesthesia Other Findings   Reproductive/Obstetrics negative OB ROS                             Anesthesia Physical Anesthesia Plan  ASA: II  Anesthesia Plan: General   Post-op Pain Management:    Induction: Intravenous  Airway Management Planned: Oral ETT  Additional Equipment: Arterial line  Intra-op Plan:   Post-operative Plan: Extubation in OR  Informed Consent: I have reviewed the patients History and Physical, chart, labs and discussed the procedure including the risks, benefits and alternatives for the proposed anesthesia with the patient or authorized representative who has indicated his/her understanding and acceptance.   Dental advisory given  Plan Discussed with: CRNA  Anesthesia Plan Comments:        Anesthesia Quick Evaluation

## 2015-12-20 NOTE — Anesthesia Procedure Notes (Signed)
Procedure Name: Intubation Date/Time: 12/20/2015 8:17 AM Performed by: Gaylene Brooks Pre-anesthesia Checklist: Patient identified, Emergency Drugs available, Suction available and Patient being monitored Patient Re-evaluated:Patient Re-evaluated prior to inductionOxygen Delivery Method: Circle System Utilized Preoxygenation: Pre-oxygenation with 100% oxygen Intubation Type: IV induction Ventilation: Mask ventilation without difficulty Laryngoscope Size: Buchner and 2 Grade View: Grade II Tube type: Oral Tube size: 7.5 mm Number of attempts: 1 Airway Equipment and Method: Stylet and Oral airway Placement Confirmation: ETT inserted through vocal cords under direct vision,  positive ETCO2 and breath sounds checked- equal and bilateral Secured at: 22 cm Tube secured with: Tape Dental Injury: Teeth and Oropharynx as per pre-operative assessment

## 2015-12-20 NOTE — H&P (Signed)
CHIEF COMPLAINT:                                          Left buttock pain, weakness; difficulty with balance. 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 Years ago, I had taken care of spondylitic disease in his lumbar spine with two separate surgeries where he has had a decompression and fusion initially, at L3-4. He subsequently had issues at L2-3, and since that time has gone on to have a fusion from L2 down to the sacrum with posterior stabilization being done at L4-5. He did reasonably well for a period of time with his back, but he notes that about three years ago that he was out playing golf and developed an episode of pain that resulted in him having to stop that game, and he notes that is the last time he played golf. He has had significant pain in the region of the left buttock, but over the years he has developed increasing weakness, such that his balance has become affected. He has tried to treat this process conservatively by working with a physical therapist, engaging in activities in a gym which he continues to do. Nonetheless, he has had persistently worsening pain. He notes that this is typically worse in the mornings when he first tries to get up, and then, with some activity, it seems to ease. He also notes that over the years he has had some prostate issues where he has had treatment for prostate cancer with seed, a TURP, but in the last year to year-and-a-half he has developed some increasing difficulties with incontinence. This initially started out as a dribble but occasionally he notes that there is just full-blown incontinence that occurs. He has been seen and evaluated a couple of years ago by one of his former colleagues, and he has had some epidural steroid injections which have not yielded significant improvement.   DIAGNOSTIC STUDIES:                                    An MRI of  the lumbar spine was performed in 2015 along with some plain x-rays. He brings these films for review, and they demonstrate that on his MRI he has evidence of the arthrodesis at L2-3, L3-4, L4-5, and L5-S1. There is nonsegmental fixation posteriorly at L4-5. The alignment of his spine is quite good with a fairly well maintained lordosis. He does have a grade I spondylolisthesis at the L5-S1 level, but he has an amply patent canal centrally. At L1-2, he has evidence of a moderately severe central canal stenosis. On review of a former myelogram performed in 2005, he had some early signs of a broad-based disc bulge at this level, with some mild facet hypertrophy but no significant stenosis at that time. Two years ago, he had the stenosis as described at L1-2. The alignment of his spine in the coronal view appears to be quite acceptable on the plain x-rays and the MRI. The plain x-ray also demonstrates that he has developed large lateral osteophytes at the L1-2 site. The SI joints and both hip joints appear to be quite  normal on those plain x-rays.   REVIEW OF SYSTEMS:                                    Notable for leg weakness, back pain, leg pain, arthritis, leg pain while walking, balance disturbance, some hearing loss, and a history of cataracts in both eyes.   PAST MEDICAL HISTORY:                                Notable for the history of prostate cancer, and he has also had some skin cancers in the past.   . Medications and Allergies:  Include Amlodipine, Hydrochlorothiazide, Omeprazole, Levothyroxine, Mirtazapine, Ibuprofen, Gabapentin, and Aspirin.  PHYSICAL EXAMINATION:   . Musculoskeletal Examination - On physical examination, I note that when he stands, he tends to favor a 5 degree forward stoop. He walks with significant antalgia involving that left lower extremity.    NEUROLOGICAL EXAMINATION:             . Motor Examination - In testing his individual motor strength, it is apparent that he  has some mild weakness in the tibialis anterior groups bilaterally, both being graded at 4/5. He has difficulty walking onto his toes also, worse on the left leg than on the right leg. Proximal leg strength appears good in the iliopsoas bilaterally.   . Deep Tendon Reflexes - The deep tendon reflexes, however, appear absent in the patellae and the Achilles both.   IMPRESSION/PLAN:                                         The patient has evidence of a significant decompression and fusion from L2 down to the sacrum.   Phil  had the myelogram/postmyelogram CAT scan of his lumbar spine done recenly.  This study is demonstrates that Abbe Amsterdam has evidence of most notably, a pseudoarthrosis at the L5-S1 level.  He has had progressive subsidence of the L5-S1 cage with a ventral osteophyte formation, and what looks like a chronic old superior endplate fracture of the S1 vertebrae           .  In addition, I note that there is no solid bony fusion across the L5-S1 space.  The other levels appear to be quite solidly fused, and the alignment is good.  There is also noted to be a fracture of the right l4 pedicle screw, that appears to also now be chronic in nature.  The fusion though across L4-5 is solid.  IMPRESSION/PLAN:                             I described the findings to Phil, I note that his spinal canal otherwise, is widely patent.  He does have some degenerative disease at L1-2 with a slight retrolisthesis, but this appears to be a fixed defect, and there is no significant compromise of the neural elements posteriorly or laterally.  There is some foraminal stenosis noted higher up at L1-2, T12-L1, and T10-T11 due to spondylitic overgrowth, but nothing that would explain the severity of the localized back pain that he has upon turning in bed and getting out of bed.  This seems to be the singular worst  problem for Phil.  I noted that I do not see any neural compromise in this study, but in terms of dealing with the  pain, I suspect the pseudoarthrosis at L5-S1 is actually what is aggravating the worst of his symptoms.    In order to repair that situation, it would require revision of posterior fusion at the L5-S1.  This is a substantial undertaking and would require fixation from the L4 to L5 hardware down to the sacrum, and indeed into the ileum.  I do not believe that screws simply placed at L5 would likely hold the construct from L2 to the sacrum.  I also suggested that if a fusion were contemplated, a bone morphogenic protein product would be of benefit such as Infuse.  However, Abbe Amsterdam does have a history of having had prostate cancer.  I believe that this would likely make this product unavailable, however, I will check with the manufactures to see if there is any contraindication, absolute or relative, with the use of this product in his situation.  Phil had had treatment of the prostate cancer with radiation seeds, and his PSA have been 0 now for six or more years.  After careful consideration of his situation Abbe Amsterdam is eager to undergo surgical correction of the pseudoarthrosis in the hope of seeing some functional improvement of his pain situation and quality of life. We have agreed to proceed.

## 2015-12-20 NOTE — Transfer of Care (Signed)
Immediate Anesthesia Transfer of Care Note  Patient: Christopher Hall  Procedure(s) Performed: Procedure(s): Revision pseudoarthrosis, Decompression Left Lumbar five Sacral one nerve root, Segmental fixation lumbar three-ilium, Posterior Arthrodesis with allograft and infuse (N/A)  Patient Location: PACU  Anesthesia Type:General  Level of Consciousness: awake and alert   Airway & Oxygen Therapy: Patient Spontanous Breathing  Post-op Assessment: Report given to RN, Post -op Vital signs reviewed and stable and Patient moving all extremities X 4  Post vital signs: Reviewed and stable  Last Vitals:  Vitals:   12/20/15 0627  BP: (!) 151/90  Pulse: 81  Resp: 20  Temp: 36.6 C    Last Pain:  Vitals:   12/20/15 0645  TempSrc:   PainSc: 3       Patients Stated Pain Goal: 3 ([id number])  Complications: No apparent anesthesia complications

## 2015-12-20 NOTE — Op Note (Signed)
Date of surgery: 12/20/2015 Preoperative diagnosis: Pseudoarthrosis L5-S1 with left lumbar radiculopathy, back pain status post decompression arthrodesis from L2-S1. Postoperative diagnosis same Procedure: Revision of pseudoarthrosis L5-S1 with decompression of left L5 and S1 nerve roots, segmental fixation from L2 to the ilium with pedicle screws and posterior lateral arthrodesis from L2-S1 using allograft and infuse. Surgeon: Kristeen Miss M.D. First assistant: Earle Gell M.D. Anesthesia: Gen. endotracheal Indications: Christopher Hall is an 82 year old individual who's had previous decompression fusion over several levels initially at L4-5 and L5-S1 using a Ray cage technique in the mid 90s subsequently had adjacent level disease and underwent surgery at L3-4 and L to 3 and ultimately required posterior stabilization at L4-5 since 2005 he apparently has developed a pseudoarthrosis at the L5-S1 level which up until that point appeared to be quite well fused he has had increasing back pain and left lower extremity pain is his main complaint and he is now taken to the operating room to undergo revision of a pseudoarthrosis that has occurred with significant foraminal stenosis for the L5 nerve roots bilaterally but also evidence of a degenerative spondylolisthesis as he also with the top of the sacrum being somewhat collapse.  Procedure: The patient was brought to the operating room supine on a stretcher. After the smooth induction of general endotracheal anesthesia, he was turned prone. The back was prepped with alcohol and DuraPrep and draped in a sterile fashion. A midline incision was reopened through the old midline scar that he had from previous surgery. This was dissected down to the lumbar dorsal fascia which was opened on either side of midline. The dissection was carried deep and then off to either side the hardware was identified. During the central portion dissection the dura was entered in the  midline inadvertently. A significant scar tissue and fascia was grown up around this area and a singular 6-0 Prolene suture was used to close the dura. The fascia overlying this was oversewn with a 3-0 Vicryl suture. Then the procedure was continued by dissecting free the hardware in its entirety. Then the hardware which is identified as Z a instrumentation from [employer] was removed. On the right side superior L4 screw was fractured and this was removed in a piecemeal fashion. Over drilling of the hole was required to remove the deepest most portion of the screw. For this reason has not felt that the right-sided L4 screw would be available were used in the surgical construct. Once the hardware was removed laminectomy was created on the left side at the L5 and S1 regions. Significant scar tissue was encountered but notably the path of the L5 nerve root was encouraged upon by some overgrown grumous material from the previously noted facet joint in this region. This was removed and the L5 nerve root was decompressed inferiorly the S1 nerve root was decompressed in a similar fashion. Once the decompression was completed the lateral gutters were then exposed and were decorticated and packed off for later use in grafting. The transverse processes at L3 L4 L5 and the sacrum oral decorticated and packed off similarly. Then the superior potion of the posterior superior iliac crest was opened and the rim of the crest bone was removed to allow placement of a pedicle screw in the top of the ilium. This was done using a freehand technique in this 70 x 7.5 mm screw was placed into the ilium first on the left side than on the right side. Next using fluoroscopic guidance pedicle entry sites were  chosen at L3 and 6.5 x 50 mm screws were placed there 7.5 x 45 mm screws were placed on the left side at L4 and on the right side at L5 because there was noted to be a breach in the pedicle of L5 on the left side no L5 screw was placed on  that side. Then to S1 screws were placed fluoroscopically guided and these measured 6.5 x 50 mm. Once all the screws were set then an appropriate size rod was used to fill the void and it was felt that a precontoured 120 mm rod would fit best on the right side and a precontoured 110 mm rod would fit best on the right side the rods were then placed after some adjustment in additional bending and secured to the Adson a neutral construct. Once the rods were torqued down into final locking position lateral gutters which had been previously packed away were then filled with strips of a medium fill of infuse along with 10 mL bags of allograft. Hemostasis was then carefully checked and all the soft tissues and the path the L5 and S1 nerve roots were again checked to make sure that there are amply decompressed. Once this was verified then the retractors were removed the wound was closed in the lumbar dorsal fascia with #1 Vicryl, the superficial fascia was irrigated and 25 mL of half percent Marcaine was injected into the paraspinous fascia. Then the subcutaneous anus tissues closed with 2-0 Vicryl interrupted fashion and 3-0 Vicryl was used to close the subcutaneous take her skin. Dermabond was placed on the skin. Patient tolerated procedure well was returned to recovery room in stable condition. Blood loss is estimated at 700 mL.

## 2015-12-21 LAB — BASIC METABOLIC PANEL
ANION GAP: 8 (ref 5–15)
BUN: 19 mg/dL (ref 6–20)
CO2: 25 mmol/L (ref 22–32)
Calcium: 8.8 mg/dL — ABNORMAL LOW (ref 8.9–10.3)
Chloride: 101 mmol/L (ref 101–111)
Creatinine, Ser: 1.52 mg/dL — ABNORMAL HIGH (ref 0.61–1.24)
GFR calc Af Amer: 47 mL/min — ABNORMAL LOW (ref >60)
GFR calc non Af Amer: 40 mL/min — ABNORMAL LOW (ref >60)
GLUCOSE: 112 mg/dL — AB (ref 65–99)
POTASSIUM: 3.3 mmol/L — AB (ref 3.5–5.1)
Sodium: 134 mmol/L — ABNORMAL LOW (ref 135–145)

## 2015-12-21 MED ORDER — DEXAMETHASONE SODIUM PHOSPHATE 4 MG/ML IJ SOLN
4.0000 mg | Freq: Once | INTRAMUSCULAR | Status: DC
  Filled 2015-12-21 (×3): qty 1

## 2015-12-21 NOTE — Progress Notes (Signed)
Patient ID: Christopher Hall, male   DOB: 1929/09/16, 82 y.o.   MRN: [id number] Vital signs are stable Motor function is intact Little complaints of pain He is ambulated in the unit Overall he is doing quite well We'll transfer him to the floor

## 2015-12-21 NOTE — Progress Notes (Signed)
Orthopedic Tech Progress Note Patient Details:  Christopher Hall [birth year] [id number] Called [employer] for vendor. Patient ID: Christopher Hall, male   DOB: 02-17-1929, 82 y.o.   MRN: [id number]   Braulio Bosch 12/21/2015, 4:02 PM

## 2015-12-21 NOTE — Evaluation (Signed)
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 PMHx: prostate CA, TURP, prior spinal fusion  Clinical Impression  Patient has decreased activity tolerance, slowed gait, and decreased awareness of back precautions, and would benefit from physical therapy to address these deficits and maximize function upon return to home. Patient tolerated session well today, but reported feeling slower than normal during ambulation. Patient was educated on back precautions, handout provided. Patient also educated on how to put brace on. Will continue to follow.   HR during session: 92-107 SPO2 during session room air: 95%     Follow Up Recommendations Outpatient PT (when cleared by physician )    Equipment Recommendations       Recommendations for Other Services       Precautions / Restrictions Precautions Precautions: Back;Fall Required Braces or Orthoses: Spinal Brace Spinal Brace: Applied in sitting position;Lumbar corset Restrictions Weight Bearing Restrictions:       Mobility  Bed Mobility Overal bed mobility: Needs Assistance Bed Mobility: Rolling;Supine to Sit Rolling: Min assist   Supine to sit: Min assist     General bed mobility comments: patient cued in back precautions, min assist with rolling to side. trunk position reinforced in sitting when patient leaned posteriorly x2.   Transfers Overall transfer level: Needs assistance   Transfers: Sit to/from Stand Sit to Stand: Min guard         General transfer comment: patient cued in back precautions  Ambulation/Gait Ambulation/Gait assistance: Min guard Ambulation Distance (Feet): 300 Feet Assistive device: Rolling walker (2 wheeled) Gait Pattern/deviations: Trunk flexed;Decreased stride length Gait velocity: decreased  Gait  velocity interpretation: Below normal speed for age/gender General Gait Details: patient received multiple cues to stand upright in walker. No rest breaks needed during ambulation.   Stairs            Wheelchair Mobility    Modified Rankin (Stroke Patients Only)       Balance Overall balance assessment: Needs assistance Sitting-balance support: Bilateral upper extremity supported Sitting balance-Leahy Scale: Good Sitting balance - Comments: patient cued to sit upright x2  Postural control: Posterior lean Standing balance support: Bilateral upper extremity supported Standing balance-Leahy Scale: Fair Standing balance comment: patient received cuing for standing upright, and used RW for UE support.                              Pertinent Vitals/Pain Pain Assessment: 0-10 Pain Score: 4  Pain Location: L buttock  Pain Descriptors / Indicators: Aching    Home Living Family/patient expects to be discharged to:: Private residence Living Arrangements: Spouse/significant other Available Help at Discharge: Family Type of Home: House Home Access: Stairs to enter Entrance Stairs-Rails: None Entrance Stairs-Number of Steps: 2  Home Layout: Two level Home Equipment: Environmental consultant - 2 wheels;Bedside commode;Shower seat Additional Comments: rollator, transport chair, long hand shower sponge     Prior Function Level of Independence: Independent         Comments: enjoys cabinet building/carpentry; patient helps with some housework      Hand Dominance        Extremity/Trunk Assessment   Upper Extremity Assessment: Overall WFL for tasks assessed           Lower Extremity Assessment: Overall WFL for tasks assessed  Communication   Communication: No difficulties  Cognition Arousal/Alertness: Awake/alert Behavior During Therapy: WFL for tasks assessed/performed Overall Cognitive Status: Within Functional Limits for tasks assessed       Memory:  Decreased recall of precautions              General Comments      Exercises     Assessment/Plan    PT Assessment Patient needs continued PT services  PT Problem List Decreased activity tolerance;Decreased mobility;Decreased knowledge of use of DME;Decreased knowledge of precautions          PT Treatment Interventions Gait training;Stair training;DME instruction;Functional mobility training;Therapeutic activities;Therapeutic exercise;Patient/family education    PT Goals (Current goals can be found in the Care Plan section)  Acute Rehab PT Goals Patient Stated Goal: return home with wife  PT Goal Formulation: With patient/family Time For Goal Achievement: 01/04/16 Potential to Achieve Goals: Good    Frequency Min 5X/week   Barriers to discharge        Co-evaluation               End of Session Equipment Utilized During Treatment: Gait belt;Back brace Activity Tolerance: Patient tolerated treatment well;No increased pain Patient left: in chair;with call bell/phone within reach;with family/visitor present           Time: 0920-0955 PT Time Calculation (min) (ACUTE ONLY): 35 min   Charges:   PT Evaluation $PT Eval Moderate Complexity: 1 Procedure PT Treatments $Gait Training: 8-22 mins   PT G Codes:        Christopher Hall 12/23/2015, 11:32 AM  Christopher Hall SPT [id number]

## 2015-12-22 ENCOUNTER — Encounter (HOSPITAL_COMMUNITY): Payer: Self-pay | Admitting: Neurological Surgery

## 2015-12-22 MED FILL — Thrombin For Soln 5000 Unit: CUTANEOUS | Qty: 5000 | Status: AC

## 2015-12-22 NOTE — Progress Notes (Signed)
Patient ID: Christopher Hall, male   DOB: 13-Dec-1929, 82 y.o.   MRN: [id number] Vital signs are stable Feels a little bit more weakness in legs today Back pain has been modest Appears to be doing reasonably well Continue with physical therapy and mobilization Before meals cardiac monitoring

## 2015-12-22 NOTE — Care Management Note (Signed)
Case Management Note  Patient Details  Name: Christopher Hall MRN: [id number] Date of Birth: May 17, 1929  Subjective/Objective:    Pt underwent lumbar surgery. He is from home with his wife from [place], [place].                 Action/Plan: MD has ordered [facility] PT/OT. CM met with the patient and his wife and they would like to use [facility]. Information faxed to them at the number provided: [phone]. CM continuing to follow.  Expected Discharge Date:                  Expected Discharge Plan:  [place]  In-House Referral:     Discharge planning Services  CM Consult  Post Acute Care Choice:  Home Health Choice offered to:  Patient, Spouse  DME Arranged:    DME Agency:     HH Arranged:  PT, OT HH Agency:   ([facility])  Status of Service:  In process, will continue to follow  If discussed at Long Length of Stay Meetings, dates discussed:    Additional Comments:  Pollie Friar, RN 12/22/2015, 3:41 PM

## 2015-12-22 NOTE — NC FL2 (Signed)
[place] LEVEL OF CARE SCREENING TOOL     IDENTIFICATION  Patient Name: Christopher Hall Birthdate: 18-Jan-1929 Sex: male Admission Date (Current Location): 12/20/2015  [place] and [place] Number:  Kathleen Argue (Patient lives in [place], [place])   Facility and Address:  [facility], [place] [address], [place], [place] [address]      Provider Number: [id number]  Attending Physician Name and Address:  Kristeen Miss, MD  Relative Name and Phone Number:  Josealejandro, [place];  [phone]     Current Level of Care: Hospital Recommended Level of Care: [place] Prior Approval Number:    Date Approved/Denied:   PASRR Number: [id number] (Eff. 12/22/15)  Discharge Plan: SNF    Current Diagnoses: Patient Active Problem List   Diagnosis Date Noted  . Pseudoarthrosis of lumbar spine 12/20/2015    Orientation RESPIRATION BLADDER Height & Weight     Self, Time, Situation, Place  Normal Continent (Urinary catheter) Weight: 185 lb 8 oz (84.1 kg) Height:  5' 6.5" (168.9 cm)  BEHAVIORAL SYMPTOMS/MOOD NEUROLOGICAL BOWEL NUTRITION STATUS      Continent Diet (Regular)  AMBULATORY STATUS COMMUNICATION OF NEEDS Skin   Limited Assist (Per PT, patient min guard with ambulation) Verbally Normal, Other (Comment) (Incision back)                       Personal Care Assistance Level of Assistance  Bathing, Feeding, Dressing Bathing Assistance: Limited assistance Feeding assistance: Independent Dressing Assistance: Limited assistance     Functional Limitations Info  Sight, Hearing, Speech Sight Info: Adequate Hearing Info: Adequate Speech Info: Adequate    SPECIAL CARE FACTORS FREQUENCY  PT (By licensed PT)     PT Frequency: Evaluated 12/5 and a minimum of 5X per week therapy recommended              Contractures Contractures Info: Not present    Additional Factors Info  Code Status, Allergies Code Status Info:  Full Allergies Info: Complication of anesthesia            Current Medications (12/22/2015):  This is the current hospital active medication list Current Facility-Administered Medications  Medication Dose Route Frequency Provider Last Rate Last Dose  . 0.9 %  sodium chloride infusion  250 mL Intravenous Continuous Kristeen Miss, MD   Stopped at 12/20/15 1733  . 0.9 %  sodium chloride infusion   Intravenous Continuous Kristeen Miss, MD 125 mL/hr at 12/21/15 0400    . acetaminophen (TYLENOL) tablet 650 mg  650 mg Oral Q4H PRN Kristeen Miss, MD   650 mg at 12/22/15 0157   Or  . acetaminophen (TYLENOL) suppository 650 mg  650 mg Rectal Q4H PRN Kristeen Miss, MD      . alum & mag hydroxide-simeth (MAALOX/MYLANTA) 200-200-20 MG/5ML suspension 30 mL  30 mL Oral Q6H PRN Kristeen Miss, MD      . amLODipine (NORVASC) tablet 10 mg  10 mg Oral QPM Kristeen Miss, MD   10 mg at 12/21/15 1714  . bisacodyl (DULCOLAX) suppository 10 mg  10 mg Rectal Daily PRN Kristeen Miss, MD      . dexamethasone (DECADRON) injection 4 mg  4 mg Intravenous Once Kristeen Miss, MD      . docusate sodium (COLACE) capsule 100 mg  100 mg Oral BID Kristeen Miss, MD   100 mg at 12/22/15 1100  . gabapentin (NEURONTIN) capsule 600 mg  600 mg Oral BID Kristeen Miss,  MD   600 mg at 12/22/15 1100  . hydrochlorothiazide (HYDRODIURIL) tablet 25 mg  25 mg Oral BID Kristeen Miss, MD   25 mg at 12/22/15 1100  . HYDROmorphone (DILAUDID) injection 0.5-1 mg  0.5-1 mg Intravenous Q2H PRN Kristeen Miss, MD      . levothyroxine (SYNTHROID, LEVOTHROID) tablet 50 mcg  50 mcg Oral QPM Kristeen Miss, MD   50 mcg at 12/21/15 1714  . magnesium citrate solution 1 Bottle  1 Bottle Oral Once PRN Kristeen Miss, MD      . menthol-cetylpyridinium (CEPACOL) lozenge 3 mg  1 lozenge Oral PRN Kristeen Miss, MD       Or  . phenol (CHLORASEPTIC) mouth spray 1 spray  1 spray Mouth/Throat PRN Kristeen Miss, MD      . methocarbamol (ROBAXIN) tablet 500 mg  500 mg Oral Q6H PRN  Kristeen Miss, MD       Or  . methocarbamol (ROBAXIN) 500 mg in dextrose 5 % 50 mL IVPB  500 mg Intravenous Q6H PRN Kristeen Miss, MD      . mirtazapine (REMERON) tablet 15 mg  15 mg Oral QHS Kristeen Miss, MD   15 mg at 12/21/15 2212  . ondansetron (ZOFRAN) injection 4 mg  4 mg Intravenous Q4H PRN Kristeen Miss, MD      . oxyCODONE-acetaminophen (PERCOCET/ROXICET) 5-325 MG per tablet 1-2 tablet  1-2 tablet Oral Q4H PRN Kristeen Miss, MD   1 tablet at 12/21/15 1715  . pantoprazole (PROTONIX) EC tablet 40 mg  40 mg Oral Daily Kristeen Miss, MD   40 mg at 12/22/15 1100  . polyethylene glycol (MIRALAX / GLYCOLAX) packet 17 g  17 g Oral Daily PRN Kristeen Miss, MD      . potassium chloride (K-DUR,KLOR-CON) CR tablet 10 mEq  10 mEq Oral QPM Kristeen Miss, MD   10 mEq at 12/21/15 1714  . psyllium (HYDROCIL/METAMUCIL) packet 1 packet  1 packet Oral BID Kristeen Miss, MD   1 packet at 12/22/15 1000  . senna (SENOKOT) tablet 8.6 mg  1 tablet Oral BID Kristeen Miss, MD   8.6 mg at 12/22/15 1100  . sodium chloride flush (NS) 0.9 % injection 3 mL  3 mL Intravenous Q12H Kristeen Miss, MD   3 mL at 12/21/15 2213  . sodium chloride flush (NS) 0.9 % injection 3 mL  3 mL Intravenous PRN Kristeen Miss, MD         Discharge Medications: Please see discharge summary for a list of discharge medications.  Relevant Imaging Results:  Relevant Lab Results:   Additional Information ss#[id number].  Sable Feil, LCSW

## 2015-12-22 NOTE — Clinical Social Work Note (Signed)
CSW received SNF consult on 12/4. FL-2 completed and talked with patient and his wife Christopher Hall at the bedside. Patient will discharge home with [facility] services. Nurse case manager aware that patient is discharging home.  CSW signing off as patient will discharge home when medically stable.  Christopher Hall, MSW, LCSW Licensed Clinical Social Worker [place] [phone]

## 2015-12-22 NOTE — Evaluation (Signed)
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 Presents with generalized weakness and decreased sitting and standing balance. Pt requiring min assist for ambulation and cues for rest breaks as he became fatigued. Educated pt and wife in back precautions related to ADL at length. Will follow acutely.  Follow Up Recommendations  Home health OT;Supervision/Assistance - 24 hour    Equipment Recommendations  Tub/shower seat    Recommendations for Other Services       Precautions / Restrictions Precautions Precautions: Back;Fall Precaution Booklet Issued: Yes (comment) Precaution Comments: educated in back precautions related to ADL Required Braces or Orthoses: Spinal Brace Spinal Brace: Applied in sitting position;Lumbar corset Restrictions Weight Bearing Restrictions: No      Mobility Bed Mobility  General bed mobility comments: pt sitting at EOB  Transfers Overall transfer level: Needs assistance Equipment used: Rolling walker (2 wheeled) Transfers: Sit to/from Stand Sit to Stand: Min assist         General transfer comment: min assist to rise and shift weight over feet    Balance Overall balance assessment: Needs assistance Sitting-balance support: Feet supported Sitting balance-Leahy Scale: Poor Sitting balance - Comments: posterior lean, requiring cues to correct   Standing balance support: Bilateral upper extremity supported Standing balance-Leahy Scale: Poor Standing balance comment: unable to maintain standing without UE support today, heavy wt through UE's                            ADL Overall ADL's : Needs assistance/impaired Eating/Feeding:  Independent;Sitting   Grooming: Wash/dry hands;Wash/dry face;Sitting;Set up Grooming Details (indicate cue type and reason): instructed in 2 cup method for oral care Upper Body Bathing: Moderate assistance;Sitting   Lower Body Bathing: Maximal assistance;Sit to/from stand   Upper Body Dressing : Sitting;Minimal assistance Upper Body Dressing Details (indicate cue type and reason): max for back brace Lower Body Dressing: Maximal assistance;Sit to/from stand Lower Body Dressing Details (indicate cue type and reason): can remove socks crossing foot over opposite knee, wife will help Toilet Transfer: Minimal assistance;Ambulation;RW     Toileting - Clothing Manipulation Details (indicate cue type and reason): educated pt to avoid twisting with pericare   Tub/Shower Transfer Details (indicate cue type and reason): pt has a backless shower seat, recommended a height adjustable shower seat with a back Functional mobility during ADLs: Minimal assistance;Rolling walker;Cueing for safety General ADL Comments: Wife participating in session. Stating she will have help at home.     Vision     Perception     Praxis      Pertinent Vitals/Pain Pain Assessment: Faces Faces Pain Scale: Hurts a little bit Pain Location: back Pain Descriptors / Indicators: Operative site guarding Pain Intervention(s): Monitored during session;Premedicated before session;Repositioned     Hand Dominance Right   Extremity/Trunk Assessment Upper Extremity Assessment Upper Extremity Assessment: Overall WFL for tasks assessed   Lower Extremity Assessment Lower Extremity Assessment: Defer to PT evaluation       Communication Communication Communication: No difficulties   Cognition Arousal/Alertness: Awake/alert Behavior During Therapy: WFL for tasks assessed/performed Overall Cognitive Status: Impaired/Different from baseline Area of Impairment: Memory;Safety/judgement     Memory: Decreased short-term  memory;Decreased recall of precautions  Safety/Judgement: Decreased awareness of deficits;Decreased awareness of safety         General Comments       Exercises       Shoulder Instructions      Home Living Family/patient expects to be discharged to:: Private residence Living Arrangements: Spouse/significant other Available Help at Discharge: Family;Available 24 hours/day Type of Home: House Home Access: Stairs to enter [employer] of Steps: 2  Entrance Stairs-Rails: None Home Layout: Two level Alternate Level Stairs-Number of Steps: live on the first floor    Bathroom Shower/Tub: Hospital doctor Toilet: Handicapped height     Home Equipment: Environmental consultant - 2 wheels;Bedside commode;Shower seat;Walker - 4 wheels;Transport chair;Hand held shower head;Grab bars - tub/shower;Grab bars - toilet   Additional Comments: recommended shower seat with a back, has a long handled sponge      Prior Functioning/Environment Level of Independence: Independent        Comments: enjoys cabinet building/carpentry; patient helps with some housework         OT Problem List: Decreased strength;Decreased activity tolerance;Impaired balance (sitting and/or standing);Decreased cognition;Decreased knowledge of use of DME or AE;Decreased knowledge of precautions;Pain   OT Treatment/Interventions: Self-care/ADL training;DME and/or AE instruction;Therapeutic activities;Visual/perceptual remediation/compensation;Patient/family education;Balance training    OT Goals(Current goals can be found in the care plan section) Acute Rehab OT Goals Patient Stated Goal: return home with wife  OT Goal Formulation: With patient Time For Goal Achievement: 12/29/15 Potential to Achieve Goals: Good ADL Goals Pt Will Perform Grooming: with min guard assist;standing Pt Will Perform Lower Body Bathing: with min assist;sit to/from stand Pt Will Perform Lower Body Dressing: with min assist;sit  to/from stand Pt Will Transfer to Toilet: with min guard assist;ambulating;bedside commode (over toilet) Pt Will Perform Toileting - Clothing Manipulation and hygiene: with min guard assist;sit to/from stand Additional ADL Goal #1: Pt will generalize back precautions in ADL and mobility. Additional ADL Goal #2: Pt and wife will be independent in donning and doffing back brace.  OT Frequency: Min 2X/week   Barriers to D/C:            Co-evaluation              End of Session    Activity Tolerance: Patient tolerated treatment well Patient left: in chair;with call bell/phone within reach;with family/visitor present   Time: [id number] OT Time Calculation (min): 23 min Charges:  OT General Charges $OT Visit: 1 Procedure OT Evaluation $OT Eval Moderate Complexity: 1 Procedure OT Treatments $Self Care/Home Management : 8-22 mins G-Codes:    Malka So 12/22/2015, 4:50 PM  [phone]

## 2015-12-22 NOTE — Progress Notes (Signed)
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 Required a standing rest break as well as consistent min A. Continue to recommend outpt PT but anticipate that he will need HHPT to bridge the gap from acute care to the time period when he is ready for outpt. PT will continue to follow.   Follow Up Recommendations  Home health PT;Supervision/Assistance - 24 hour     Equipment Recommendations  None recommended by PT    Recommendations for Other Services       Precautions / Restrictions Precautions Precautions: Back;Fall Precaution Booklet Issued: Yes (comment) Precaution Comments: pt could recall 2/3 precautions, forgot twisting Required Braces or Orthoses: Spinal Brace Spinal Brace: Applied in sitting position;Lumbar corset Restrictions Weight Bearing Restrictions: No    Mobility  Bed Mobility Overal bed mobility: Needs Assistance Bed Mobility: Rolling;Sit to Sidelying Rolling: Supervision       Sit to sidelying: Min assist General bed mobility comments: pt needed assist getting LE's into bed but could roll onto back keeping precautions and position self using bridging within bed  Transfers Overall transfer level: Needs assistance Equipment used: Rolling walker (2 wheeled) Transfers: Sit to/from Stand Sit to Stand: Min assist         General transfer comment: min A for power up today as pt felt weak in LE's, took several seconds to gain balance before being able to ambulate. Pt denied dizziness  Ambulation/Gait Ambulation/Gait assistance: Min assist Ambulation Distance (Feet): 250 Feet Assistive device: Rolling  walker (2 wheeled) Gait Pattern/deviations: Decreased stride length;Trunk flexed Gait velocity: decreased Gait velocity interpretation: <1.8 ft/sec, indicative of risk for recurrent falls General Gait Details: frequent verbal and tactile cues for erect posture. As pt fatigued he had increasing knee flexion. At 125' took standing rest break as gait was becoming unsafe. Improved gait after rest. Pt reports that L knee sometimes gives out and when he feels that this may happen he shortens his step length. But when he does this, it creates a stutter step and decreased balance, even with RW.    Stairs            Wheelchair Mobility    Modified Rankin (Stroke Patients Only)       Balance Overall balance assessment: Needs assistance Sitting-balance support: Feet supported Sitting balance-Leahy Scale: Good Sitting balance - Comments: no LOB in sitting with LE motions   Standing balance support: Bilateral upper extremity supported Standing balance-Leahy Scale: Poor Standing balance comment: unable to maintain standing without UE support today, heavy wt through UE's                    Cognition Arousal/Alertness: Awake/alert Behavior During Therapy: WFL for tasks assessed/performed Overall Cognitive Status: Within Functional Limits for tasks assessed       Memory: Decreased recall of precautions              Exercises Total Joint Exercises Bridges: AROM;Supine;5 reps    General Comments General comments (skin integrity, edema, etc.): Pt MMT again today with roughly equal strength LE's (4/5 throughout) but with decreased neuromuscular control on the L side and ability to maintain steady and smooth contractions.  Pertinent Vitals/Pain Pain Assessment: Faces Faces Pain Scale: Hurts even more Pain Location: back and LLE Pain Descriptors / Indicators: Aching Pain Intervention(s): Limited activity within patient's tolerance;Monitored during session    Home  Living                      Prior Function            PT Goals (current goals can now be found in the care plan section) Acute Rehab PT Goals Patient Stated Goal: return home with wife  PT Goal Formulation: With patient/family Time For Goal Achievement: 01/04/16 Potential to Achieve Goals: Good Progress towards PT goals: Progressing toward goals    Frequency    Min 5X/week      PT Plan Discharge plan needs to be updated    Co-evaluation             End of Session Equipment Utilized During Treatment: Gait belt;Back brace Activity Tolerance: Patient limited by fatigue Patient left: in bed;with call bell/phone within reach;with family/visitor present     Time: 0925-0957 PT Time Calculation (min) (ACUTE ONLY): 32 min  Charges:  $Gait Training: 23-37 mins                    G Codes:     Leighton Roach, PT  Acute Rehab Services  [place] 12/22/2015, 2:04 PM

## 2015-12-23 ENCOUNTER — Encounter (HOSPITAL_COMMUNITY): Payer: Self-pay | Admitting: General Practice

## 2015-12-23 MED ORDER — METHOCARBAMOL 500 MG PO TABS: 500.0000 mg | ORAL_TABLET | Freq: Four times a day (QID) | ORAL | 2 refills | Status: AC | PRN

## 2015-12-23 MED ORDER — OXYCODONE-ACETAMINOPHEN 5-325 MG PO TABS: 1.0000 | ORAL_TABLET | ORAL | 0 refills | Status: AC | PRN

## 2015-12-23 MED ORDER — DEXAMETHASONE 1 MG PO TABS: ORAL_TABLET | ORAL | 0 refills | Status: AC

## 2015-12-23 MED ORDER — DEXAMETHASONE 2 MG PO TABS
2.0000 mg | ORAL_TABLET | Freq: Once | ORAL | Status: AC
  Administered 2015-12-23: 2 mg via ORAL
  Filled 2015-12-23: qty 1

## 2015-12-23 MED FILL — Sodium Chloride IV Soln 0.9%: INTRAVENOUS | Qty: 2000 | Status: AC

## 2015-12-23 NOTE — Progress Notes (Signed)
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 PMHx: prostate CA, TURP, prior spinal fusion    PT Comments    Pt performed stair training in prep for d/c home. Pt remains limited due to impulsivity and decreased safety.  Wife educated to supervise mobility.  Pt educated on signs of too much and to continue mobility at home after d/c.    Follow Up Recommendations  Home health PT;Supervision/Assistance - 24 hour     Equipment Recommendations  None recommended by PT    Recommendations for Other Services       Precautions / Restrictions Precautions Precautions: Back;Fall Precaution Comments: reinforced back precautions with bed mobility, ADL and walker use Required Braces or Orthoses: Spinal Brace Spinal Brace: Applied in sitting position;Lumbar corset (Pt able to apply in sitting reviewed placement of velcro to achieve proper fit.  ) Restrictions Weight Bearing Restrictions: No    Mobility  Bed Mobility Overal bed mobility: Needs Assistance Bed Mobility: Supine to Sit Rolling: Supervision Sidelying to sit: Supervision     Sit to sidelying: Supervision General bed mobility comments: cues for log roll, no assist  Transfers Overall transfer level: Needs assistance Equipment used: Rolling walker (2 wheeled) Transfers: Sit to/from Stand Sit to Stand: Min guard         General transfer comment: Slight unsteadiness no true LOB.    Ambulation/Gait Ambulation/Gait assistance: Supervision;Min guard Ambulation Distance (Feet): 120 Feet (decreased gait distance after fatigue from stair training. ) Assistive device: Rolling walker (2 wheeled) Gait Pattern/deviations: Decreased stride length;Trunk flexed Gait velocity: decreased Gait velocity interpretation: Below normal speed for  age/gender General Gait Details: frequent verbal and tactile cues for erect posture. As pt fatigued he had increasing knee flexion. Pt required cues for placement/positioning of RW during gait.     Stairs Stairs: Yes   Stair Management: Two rails Number of Stairs: 12 General stair comments: Cues to maintain step to pattern for safety.  Pt motivated to complete flight with increased WOB observed.  Pt had decreased clearance of R foot x1 during ascention.    Wheelchair Mobility    Modified Rankin (Stroke Patients Only)       Balance Overall balance assessment: Needs assistance Sitting-balance support: Feet supported Sitting balance-Leahy Scale: Fair       Standing balance-Leahy Scale: Poor Standing balance comment: able to use B UEs during standing ADL at sink                    Cognition Arousal/Alertness: Awake/alert Behavior During Therapy: Impulsive Overall Cognitive Status: Impaired/Different from baseline Area of Impairment: Memory;Safety/judgement     Memory: Decreased short-term memory;Decreased recall of precautions   Safety/Judgement: Decreased awareness of safety          Exercises      General Comments        Pertinent Vitals/Pain Pain Assessment: No/denies pain    Home Living                      Prior Function            PT Goals (current goals can now be found in the care plan section) Acute Rehab PT Goals Patient Stated Goal: return home with wife  Potential to Achieve Goals: Good Progress towards PT goals: Progressing toward goals    Frequency  Min 5X/week      PT Plan Current plan remains appropriate    Co-evaluation             End of Session Equipment Utilized During Treatment: Gait belt;Back brace Activity Tolerance: Patient limited by fatigue Patient left: in chair;with family/visitor present (wife present)     Time: [id number] PT Time Calculation (min) (ACUTE ONLY): 17 min  Charges:   $Gait Training: 8-22 mins                    G Codes:      [name] 01/03/16, 3:53 PM  [name], PTA pager [phone]

## 2015-12-23 NOTE — Discharge Summary (Signed)
Physician Discharge Summary  Patient ID: Christopher Hall MRN: [id number] DOB/AGE: 1929-07-15 82 y.o.  Admit date: 12/20/2015 Discharge date: 12/23/2015  Admission Diagnoses:Lumbar pseudoarthrosis L5-S1  Discharge Diagnoses: Pseudoarthrosis L5-S1 with lumbar radiculopathy Active Problems:   Pseudoarthrosis of lumbar spine   Discharged Condition: good  Hospital Course: Patient was admitted to undergo surgical decompression and stabilization across lumbosacral junction. He underwent fixation from L3 to the ilium. Posterior lateral arthrodesis with local autograft and infuse was performed. He tolerated surgery well.  Consults: None  Significant Diagnostic Studies: None  Treatments: surgery: Decompression L5 and S1 on the left side with posterior lateral arthrodesis L3 to the ilium using pedicle screw fixation and posterior lateral arthrodesis  Discharge Exam: Blood pressure 129/64, pulse 96, temperature 99.1 F (37.3 C), temperature source Oral, resp. rate 18, height 5' 6.5" (1.689 m), weight 84.1 kg (185 lb 8 oz), SpO2 96 %. Incision is clean and dry. Station and gait are intact.  Disposition:  discharged home  Discharge Instructions    Call MD for:  redness, tenderness, or signs of infection (pain, swelling, redness, odor or green/yellow discharge around incision site)    Complete by:  As directed    Call MD for:  severe uncontrolled pain    Complete by:  As directed    Call MD for:  temperature >100.4    Complete by:  As directed    Diet - low sodium heart healthy    Complete by:  As directed    Discharge instructions    Complete by:  As directed    Okay to shower. Do not apply salves or appointments to incision. No heavy lifting with the upper extremities greater than 15 pounds. May resume driving when not requiring pain medication and patient feels comfortable with doing so.   Increase activity slowly    Complete by:  As directed        Medication List    TAKE these  medications   amLODipine 10 MG tablet Commonly known as:  NORVASC Take 10 mg by mouth every evening.   aspirin EC 81 MG tablet Take 81 mg by mouth 2 (two) times daily.   dexamethasone 1 MG tablet Commonly known as:  DECADRON one tablet twice daily for 2 days, one tablet daily for 2 days.   gabapentin 300 MG capsule Commonly known as:  NEURONTIN Take 600 mg by mouth 2 (two) times daily.   hydrochlorothiazide 25 MG tablet Commonly known as:  HYDRODIURIL Take 25 mg by mouth 2 (two) times daily.   ibuprofen 600 MG tablet Commonly known as:  ADVIL,MOTRIN Take 600 mg by mouth 2 (two) times daily.   levothyroxine 50 MCG tablet Commonly known as:  SYNTHROID, LEVOTHROID Take 50 mcg by mouth every evening.   methocarbamol 500 MG tablet Commonly known as:  ROBAXIN Take 1 tablet (500 mg total) by mouth every 6 (six) hours as needed for muscle spasms.   mirtazapine 15 MG tablet Commonly known as:  REMERON Take 15 mg by mouth at bedtime.   omeprazole 20 MG capsule Commonly known as:  PRILOSEC Take 20 mg by mouth 2 (two) times daily.   oxyCODONE-acetaminophen 5-325 MG tablet Commonly known as:  PERCOCET/ROXICET Take 1-2 tablets by mouth every 4 (four) hours as needed for moderate pain.   potassium chloride 10 MEQ tablet Commonly known as:  K-DUR,KLOR-CON Take 10 mEq by mouth every evening.   psyllium 58.6 % powder Commonly known as:  METAMUCIL Take 1 packet by mouth  2 (two) times daily.   Vitamin D3 2000 units capsule Take 2,000 Units by mouth 2 (two) times daily.        SignedEarleen Newport 12/23/2015, 6:58 PM

## 2015-12-23 NOTE — Progress Notes (Signed)
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 PMHx: prostate CA, TURP, prior spinal fusion   OT comments  Reinforced back precautions related to ADL and mobility. Wife and pt independent in donning and doffing brace. Wife will assist with LB bathing and dressing as needed until pt can perform without bending. Pt requiring min guard to supervision for all mobility this visit. Pt and wife without further concerns. Plan is for home tomorrow.  Follow Up Recommendations  Home health OT;Supervision/Assistance - 24 hour    Equipment Recommendations  None recommended by OT    Recommendations for Other Services      Precautions / Restrictions Precautions Precautions: Back;Fall Precaution Comments: reinforced back precautions with bed mobility, ADL and walker use Required Braces or Orthoses: Spinal Brace Spinal Brace: Applied in sitting position;Lumbar corset       Mobility Bed Mobility Overal bed mobility: Needs Assistance Bed Mobility: Rolling;Sidelying to Sit;Sit to Sidelying Rolling: Supervision Sidelying to sit: Supervision     Sit to sidelying: Supervision General bed mobility comments: cues for log roll, no assist  Transfers Overall transfer level: Needs assistance Equipment used: Rolling walker (2 wheeled) Transfers: Sit to/from Stand Sit to Stand: Supervision         General transfer comment: no physical assist, cues for hand placement with stand to sit    Balance Overall balance assessment: Needs assistance Sitting-balance support: Feet supported Sitting balance-Leahy Scale: Fair       Standing balance-Leahy Scale: Poor Standing balance comment: able to use B UEs during standing ADL at sink                   ADL Overall ADL's : Needs assistance/impaired      Grooming: Wash/dry hands;Standing;Brushing hair;Supervision/safety Grooming Details (indicate cue type and reason): cues to avoid twisting with paper towel reach         Upper Body Dressing : Set up;Sitting Upper Body Dressing Details (indicate cue type and reason): back brace, instructed to pull brace out prior to putting it on     Toilet Transfer: Ambulation;RW;Min guard Toilet Transfer Details (indicate cue type and reason): stood to urinate Toileting- Water quality scientist and Hygiene: Sit to/from stand;Min Child psychotherapist Details (indicate cue type and reason): wife has ordered a tub seat with back for home, pt showered with NT, wife without concerns about transfer Functional mobility during ADLs: Min Personal assistant     Praxis      Cognition   Behavior During Therapy: Impulsive (pt needing to urinate urgently) Overall Cognitive Status: Impaired/Different from baseline Area of Impairment: Memory;Safety/judgement     Memory: Decreased short-term memory;Decreased recall of precautions    Safety/Judgement: Decreased awareness of safety          Extremity/Trunk Assessment               Exercises     Shoulder Instructions       General Comments      Pertinent Vitals/ Pain       Pain Assessment: No/denies pain  Home Living  Prior Functioning/Environment              Frequency  Min 2X/week        Progress Toward Goals  OT Goals(current goals can now be found in the care plan section)  Progress towards OT goals: Progressing toward goals  Acute Rehab OT Goals Patient Stated Goal: return home with wife  Time For Goal Achievement: 12/29/15 Potential to Achieve Goals: Good  Plan Discharge plan remains appropriate    Co-evaluation                 End of Session Equipment Utilized During Treatment:  Rolling walker;Back brace;Gait belt   Activity Tolerance Patient tolerated treatment well   Patient Left in bed;with call bell/phone within reach;with family/visitor present   Nurse Communication          Time: 1417-1440 OT Time Calculation (min): 23 min  Charges: OT General Charges $OT Visit: 1 Procedure OT Treatments $Self Care/Home Management : 23-37 mins  Malka So 12/23/2015, 2:45 PM  [phone]

## 2015-12-23 NOTE — Progress Notes (Signed)
Patient ID: Christopher Hall, male   DOB: 09-29-1929, 82 y.o.   MRN: [id number] Vital signs are stable Motor function appears normal in lower extremities Pain rated at a level of 2 Bowels have not moved Patient is starting to walk increasingly independently however he lives in [facility] and after some discussion with his wife like to spend another day in hospital to assure his independence at home. Patient will have a shower today will use some laxatives to induce bowel movement.

## 2015-12-23 NOTE — Progress Notes (Signed)
Patient ambulated 60 feet with stand by assist using walker and front wheel walker. Tolerated well, states his pain is 2/10. Patient is now sitting up in chair eating breakfast. Call bell at patient's side.

## 2015-12-24 NOTE — Progress Notes (Signed)
Patient discharged home with wife. RN discussed discharge instructions with wife and family, they vocalized understanding of medications, pain control, wound care. States pain is 2/10. Neuro assessment unchanged. Patient refused to take medications, states he may have upset stomach on ride home. States he will take them at home.

## 2015-12-24 NOTE — Progress Notes (Signed)
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 PMHx: prostate CA, TURP, prior spinal fusion    PT Comments    Pt performed increased gait and reviewed stair training in prep for d/c.  Pt is requiring decreased assist and wife is very supported.  Education provided for brace application and stair training.  Patient and wife able to teach back method to PTA.    Follow Up Recommendations  Home health PT;Supervision/Assistance - 24 hour     Equipment Recommendations  None recommended by PT    Recommendations for Other Services       Precautions / Restrictions Precautions Precautions: Back;Fall Precaution Comments: reinforced back precautions with bed mobility, ADL and walker use.  Able to recall 3/3 precautions.   Spinal Brace: Applied in sitting position;Lumbar corset (educated wife on placement of velcro for proper fit.  ) Restrictions Weight Bearing Restrictions: No    Mobility  Bed Mobility   Bed Mobility: Sidelying to Sit;Sit to Sidelying;Rolling Rolling: Modified independent (Device/Increase time) Sidelying to sit: Modified independent (Device/Increase time)     Sit to sidelying: Modified independent (Device/Increase time) General bed mobility comments: Good technique, no cues required for log rolling.    Transfers Overall transfer level: Needs assistance Equipment used: Rolling walker (2 wheeled) Transfers: Sit to/from Stand Sit to Stand: Supervision         General transfer comment: Cues for hand placement.    Ambulation/Gait Ambulation/Gait assistance: Supervision Ambulation Distance (Feet): 250 Feet Assistive device: Rolling walker (2 wheeled) Gait Pattern/deviations: Decreased stride length;Trunk flexed Gait velocity: decreased Gait velocity interpretation: Below  normal speed for age/gender General Gait Details: Pt remains to require frequent cueing for erect posture during gait.  Cues for RW use to avoid pushing heavily through B LEs.     Stairs Stairs: Yes   Stair Management: Two rails;Forwards;Step to pattern Number of Stairs: 2 General stair comments: Cues for sequencing and reviewed placement of RW to ensure RW is in the correct position after stair negotiation for use.  Reviewed with wife and patient.    Wheelchair Mobility    Modified Rankin (Stroke Patients Only)       Balance Overall balance assessment: Needs assistance Sitting-balance support: Feet supported Sitting balance-Leahy Scale: Good       Standing balance-Leahy Scale: Fair                      Cognition Arousal/Alertness: Awake/alert Behavior During Therapy: WFL for tasks assessed/performed (less impulsive during tasks today.  )                        Exercises      General Comments        Pertinent Vitals/Pain Pain Assessment: No/denies pain    Home Living                      Prior Function            PT Goals (current goals can now be found in the care plan section) Acute Rehab PT Goals Patient Stated Goal: return home with wife  Potential to Achieve Goals: Good Progress towards PT goals: Progressing toward goals    Frequency    Min 5X/week      PT Plan Current plan remains appropriate  Co-evaluation             End of Session Equipment Utilized During Treatment: Gait belt;Back brace Activity Tolerance: Patient tolerated treatment well Patient left: in bed;with call bell/phone within reach;with family/visitor present     Time: [id number] PT Time Calculation (min) (ACUTE ONLY): 23 min  Charges:  $Gait Training: 8-22 mins $Therapeutic Activity: 8-22 mins                    G Codes:      [name] 01-23-16, 10:19 AM  [name], PTA pager [phone]

## 2015-12-24 NOTE — Care Management Note (Signed)
Case Management Note  Patient Details  Name: BRISCO BOLEYN MRN: [id number] Date of Birth: 15-Aug-1929  Subjective/Objective:                    Action/Plan: Pt discharging home today with [facility] PT/OT through [place]. No DME needs per PT/OT. Bedside RN updated.  Expected Discharge Date:                  Expected Discharge Plan:  [place]  In-House Referral:     Discharge planning Services  CM Consult  Post Acute Care Choice:  Home Health Choice offered to:  Patient, Spouse  DME Arranged:    DME Agency:     HH Arranged:  PT, OT HH Agency:   ([facility])  Status of Service:  Completed, signed off  If discussed at [place] of Stay Meetings, dates discussed:    Additional Comments:  Pollie Friar, RN 12/24/2015, 10:07 AM

## 2016-03-31 ENCOUNTER — Other Ambulatory Visit: Payer: Self-pay | Admitting: Neurological Surgery

## 2016-03-31 DIAGNOSIS — R29898 Other symptoms and signs involving the musculoskeletal system: Secondary | ICD-10-CM

## 2016-04-05 ENCOUNTER — Ambulatory Visit
Admission: RE | Admit: 2016-04-05 | Discharge: 2016-04-05 | Disposition: A | Discharge status: Home / Self Care | Payer: Medicare Other | Source: Ambulatory Visit | Attending: Neurological Surgery | Admitting: Neurological Surgery

## 2016-04-05 DIAGNOSIS — R29898 Other symptoms and signs involving the musculoskeletal system: Secondary | ICD-10-CM

## 2017-05-26 IMAGING — CT CT L SPINE W/O CM
4 of 10 series · 11 of 33 positions shown, 13 images · non-contrast
Comparison: CT lumbar 10/27/2015

CLINICAL DATA: Weakness bilateral lower extremities. History
prostate cancer. Multiple back surgeries.

EXAM:
CT LUMBAR SPINE WITHOUT CONTRAST
TECHNIQUE: Multidetector CT imaging of the lumbar spine was performed without
intravenous contrast administration. Multiplanar CT image
reconstructions were also generated.

[Series 4: l spine bone · axial · 0.35mm/px · z∈[-14,+62]mm · 2 of 91 slices shown]
[im 31/91  bone]
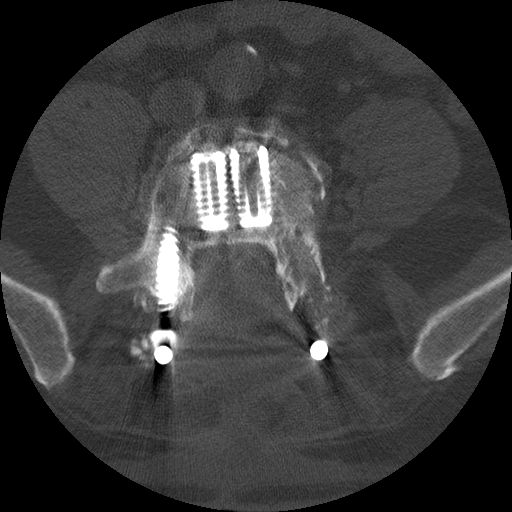
[im 61/91  bone]
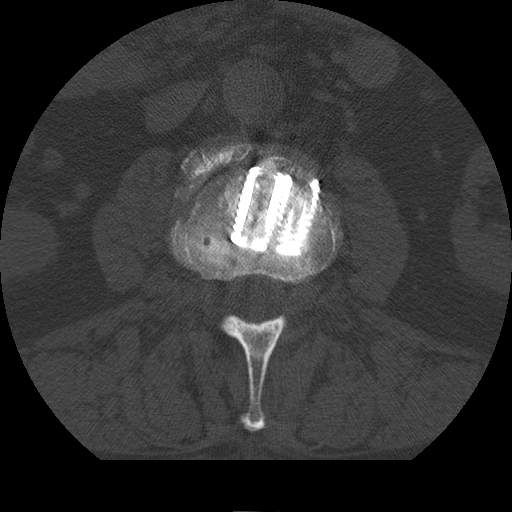

[Series 5: l spine detail · axial · 0.35mm/px · z∈[-34,+79]mm · 3 of 91 slices shown, 4 images]
[im 23/91  soft-tissue]
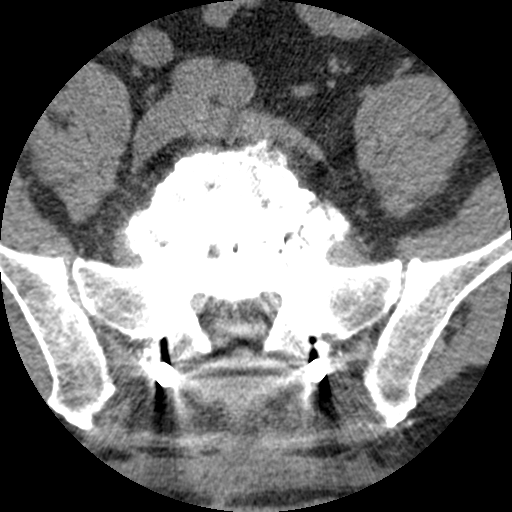
[im 23/91  bone]
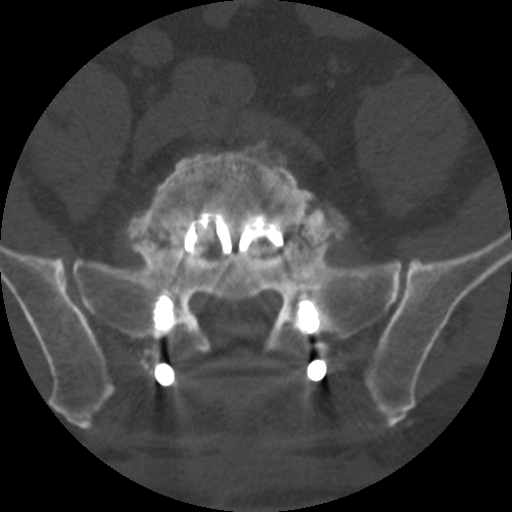
[im 46/91  bone]
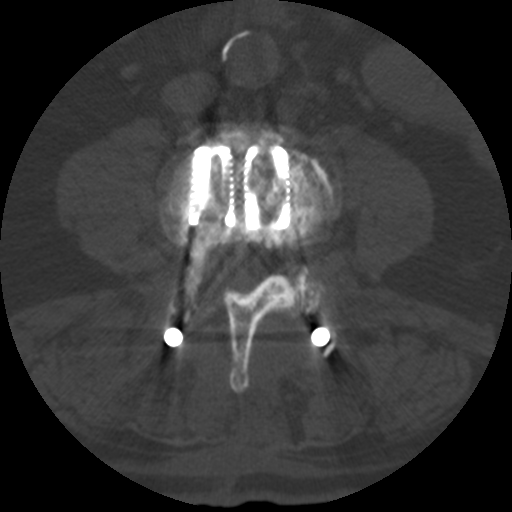
[im 68/91  bone]
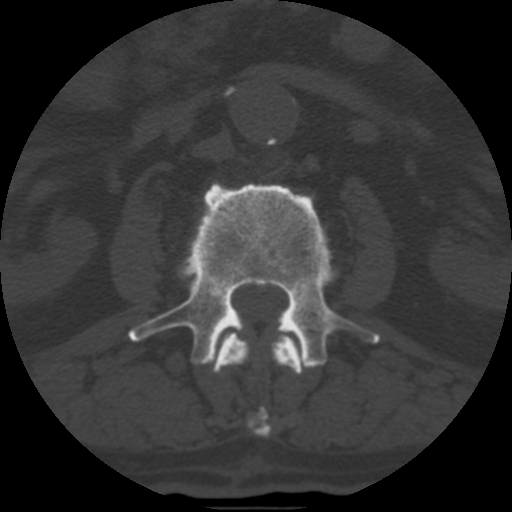

[Series 201: cor 2 · coronal · 0.45mm/px · 1 of 75 slices shown]
[im 38/75  bone]
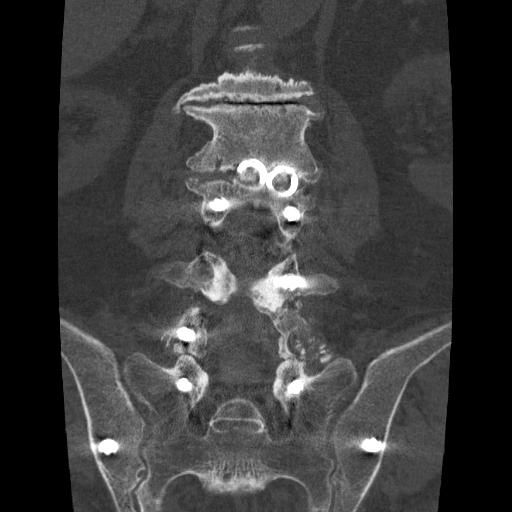

[Series 202: sag · sagittal · 0.45mm/px · 5 of 90 slices shown, 6 images]
[im 30/90  bone]
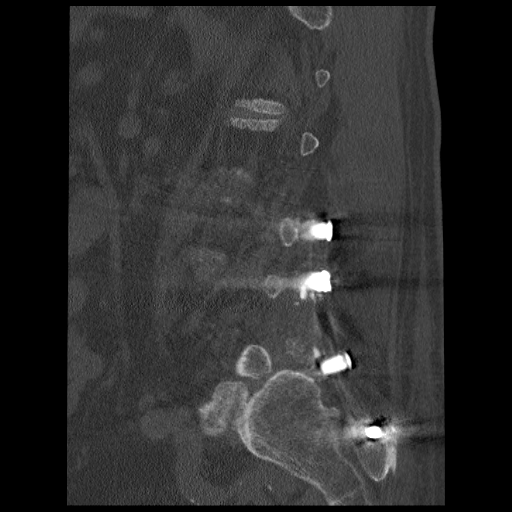
[im 38/90  bone]
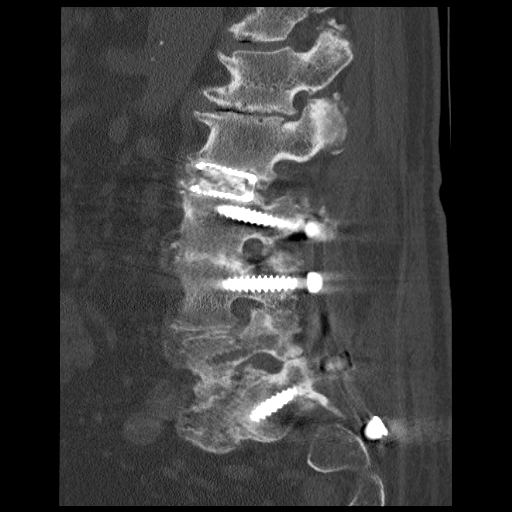
[im 45/90  soft-tissue]
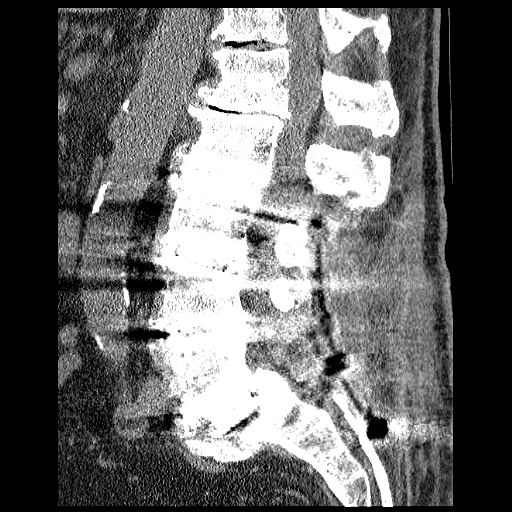
[im 45/90  bone]
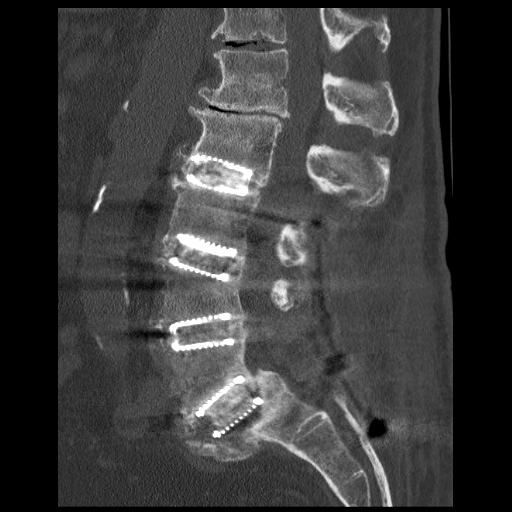
[im 52/90  bone]
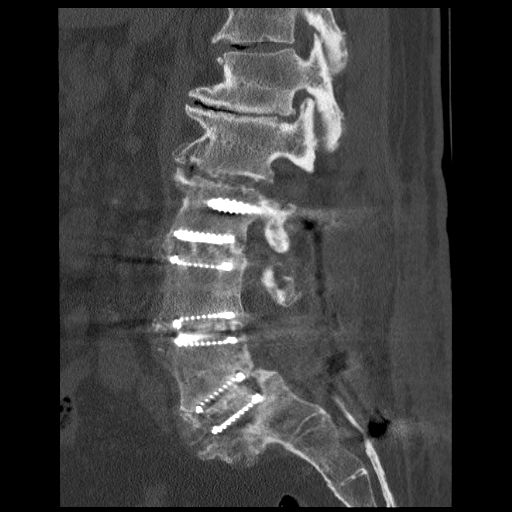
[im 60/90  bone]
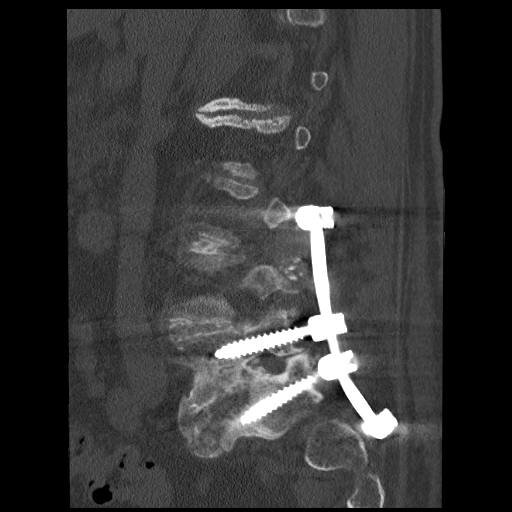

[11 of 33 positions shown; findings below may reference images not displayed]

FINDINGS: Segmentation: Normal

Alignment: Mild retrolisthesis L1-2 and L2-3 unchanged. 10 mm
anterolisthesis L5-S1 similar degree. Bilateral pars defects of L5
are chronic and unchanged.

Vertebrae: Negative for fracture.

Paraspinal and other soft tissues: Mild atherosclerotic disease in
the aorta. No retroperitoneal mass or adenopathy.

Disc levels: Ray cage fusion at L2-3, L3-4, L4-5, and L5-S1 is
unchanged. There has been extension of posterior fusion since the
prior study. Pedicle screw and posterior rod fusion now extends from
L3 through the iliac bones bilaterally. Previously noted fracture
right L4 screw has been removed.

T12-L1: Diffuse disc bulging and facet hypertrophy with mild spinal
stenosis

L1-2: Advanced disc degeneration with diffuse endplate spurring is
unchanged. This is causing mild to moderate spinal stenosis and
moderate foraminal encroachment bilaterally.

L2-3: Threaded interbody cages unchanged in position with solid bony
fusion.

L3-4: Threaded interbody cages unchanged with subsidence of the left
cage into the L4 vertebral body unchanged. Solid interbody fusion.
No significant stenosis.

L4-5: Threaded interbody cages unchanged in position. Solid
interbody fusion without spinal stenosis. Posterior decompression.
Soft tissue fluid collection posterior to the dura, not well
evaluated by CT but likely postoperative in nature.

L5-S1: Bilateral pars defects of L5. 10 mm anterolisthesis
unchanged. Interval growth of bone around the cages since prior
study. Posterior hardware in good position. Moderate to severe
foraminal encroachment bilaterally due to bone spurring similar to
the prior study.
IMPRESSION: Since the prior CT myelogram, there has been extension of the
posterior aspect the fusion from L3 through the iliac bone. New
hardware is in satisfactory position.

Mild spinal stenosis T12-L1 due to spurring

Mild to moderate spinal stenosis L1-2 due to spurring

10 mm anterolisthesis L5-S1 unchanged with bilateral pars defects of
L5. Interval growth of bone around the cages at L5-S1 since the
prior study. There remains significant foraminal encroachment
bilaterally due to spurring.

## 2020-10-16 DEATH — deceased
